# Patient Record
Sex: Male | Born: 1990 | Race: White | Hispanic: No | Marital: Single | Smoking: Current every day smoker
Health system: Southern US, Community
[De-identification: ages and names within clinical notes are randomized; demographics above are authoritative.]

## PROBLEM LIST (undated history)

## (undated) DIAGNOSIS — F1910 Other psychoactive substance abuse, uncomplicated: Secondary | ICD-10-CM

## (undated) DIAGNOSIS — T1491XA Suicide attempt, initial encounter: Secondary | ICD-10-CM

## (undated) DIAGNOSIS — Z8781 Personal history of (healed) traumatic fracture: Secondary | ICD-10-CM

## (undated) HISTORY — PX: FRACTURE SURGERY: SHX138

## (undated) HISTORY — PX: OTHER SURGICAL HISTORY: SHX169

---
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
## 2008-11-22 ENCOUNTER — Inpatient Hospital Stay (HOSPITAL_COMMUNITY): Admission: AD | Admit: 2008-11-22 | Discharge: 2008-11-24 | Payer: Self-pay | Admitting: *Deleted

## 2008-11-22 ENCOUNTER — Emergency Department (HOSPITAL_COMMUNITY): Admission: EM | Admit: 2008-11-22 | Discharge: 2008-11-22 | Payer: Self-pay | Admitting: Emergency Medicine

## 2008-11-22 ENCOUNTER — Ambulatory Visit: Payer: Self-pay | Admitting: *Deleted

## 2009-08-26 ENCOUNTER — Emergency Department (HOSPITAL_COMMUNITY): Admission: EM | Admit: 2009-08-26 | Discharge: 2009-08-26 | Payer: Self-pay | Admitting: Emergency Medicine

## 2010-07-14 LAB — RAPID URINE DRUG SCREEN, HOSP PERFORMED
Barbiturates: NOT DETECTED
Benzodiazepines: POSITIVE — AB
Cocaine: NOT DETECTED
Opiates: NOT DETECTED

## 2010-07-14 LAB — BASIC METABOLIC PANEL
CO2: 29 mEq/L (ref 19–32)
Calcium: 9.3 mg/dL (ref 8.4–10.5)
GFR calc Af Amer: >60 mL/min (ref >60)
GFR calc non Af Amer: >60 mL/min (ref >60)
Glucose, Bld: 86 mg/dL (ref 70–99)
Sodium: 141 mEq/L (ref 135–145)

## 2010-07-14 LAB — CBC
MCHC: 35.3 g/dL (ref 30.0–36.0)
MCV: 95.3 fL (ref 78.0–100.0)
RBC: 4.63 MIL/uL (ref 4.22–5.81)
RDW: 12.6 % (ref 11.5–15.5)
WBC: 6.8 10*3/uL (ref 4.0–10.5)
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
## 2011-03-04 ENCOUNTER — Emergency Department (HOSPITAL_COMMUNITY): Payer: Self-pay

## 2011-03-04 ENCOUNTER — Emergency Department (HOSPITAL_COMMUNITY)
Admission: EM | Admit: 2011-03-04 | Discharge: 2011-03-05 | Disposition: A | Discharge status: Home / Self Care | Payer: Self-pay | Attending: Emergency Medicine | Admitting: Emergency Medicine

## 2011-03-04 DIAGNOSIS — R109 Unspecified abdominal pain: Secondary | ICD-10-CM | POA: Insufficient documentation

## 2011-03-04 DIAGNOSIS — M542 Cervicalgia: Secondary | ICD-10-CM | POA: Insufficient documentation

## 2011-03-04 DIAGNOSIS — F17200 Nicotine dependence, unspecified, uncomplicated: Secondary | ICD-10-CM | POA: Insufficient documentation

## 2011-03-04 DIAGNOSIS — W1789XA Other fall from one level to another, initial encounter: Secondary | ICD-10-CM | POA: Insufficient documentation

## 2011-03-04 DIAGNOSIS — IMO0002 Reserved for concepts with insufficient information to code with codable children: Secondary | ICD-10-CM | POA: Insufficient documentation

## 2011-03-04 DIAGNOSIS — M25569 Pain in unspecified knee: Secondary | ICD-10-CM | POA: Insufficient documentation

## 2011-03-04 DIAGNOSIS — S32009A Unspecified fracture of unspecified lumbar vertebra, initial encounter for closed fracture: Secondary | ICD-10-CM | POA: Insufficient documentation

## 2011-03-04 DIAGNOSIS — R51 Headache: Secondary | ICD-10-CM | POA: Insufficient documentation

## 2011-03-04 DIAGNOSIS — R079 Chest pain, unspecified: Secondary | ICD-10-CM | POA: Insufficient documentation

## 2011-03-04 LAB — BASIC METABOLIC PANEL
CO2: 27 mEq/L (ref 19–32)
Chloride: 100 mEq/L (ref 96–112)
Creatinine, Ser: 0.91 mg/dL (ref 0.50–1.35)
GFR calc non Af Amer: >90 mL/min (ref >90)
Sodium: 139 mEq/L (ref 135–145)

## 2011-03-04 LAB — URINALYSIS, ROUTINE W REFLEX MICROSCOPIC
Glucose, UA: NEGATIVE mg/dL
Leukocytes, UA: NEGATIVE
Specific Gravity, Urine: <1.005 — ABNORMAL LOW (ref 1.005–1.030)
Urobilinogen, UA: 0.2 mg/dL (ref 0.0–1.0)
pH: 6 (ref 5.0–8.0)

## 2011-03-04 LAB — CBC
HCT: 50 % (ref 39.0–52.0)
MCH: 32.4 pg (ref 26.0–34.0)
RBC: 5.4 MIL/uL (ref 4.22–5.81)
RDW: 12.2 % (ref 11.5–15.5)
WBC: 15.1 10*3/uL — ABNORMAL HIGH (ref 4.0–10.5)

## 2011-03-04 LAB — URINE MICROSCOPIC-ADD ON

## 2011-03-04 MED ORDER — SODIUM CHLORIDE 0.9 % IV SOLN
INTRAVENOUS | Status: DC
  Administered 2011-03-04: 23:00:00 via INTRAVENOUS
  Administered 2011-03-05: 1000 mL via INTRAVENOUS

## 2011-03-04 MED ORDER — ONDANSETRON HCL 4 MG/2ML IJ SOLN
4.0000 mg | Freq: Once | INTRAMUSCULAR | Status: AC
  Administered 2011-03-04: 4 mg via INTRAVENOUS
  Filled 2011-03-04: qty 2

## 2011-03-04 MED ORDER — FENTANYL CITRATE 0.05 MG/ML IJ SOLN
50.0000 ug | Freq: Once | INTRAMUSCULAR | Status: AC
  Administered 2011-03-04: 23:00:00 via INTRAVENOUS
  Filled 2011-03-04: qty 2

## 2011-03-04 MED ORDER — IOHEXOL 300 MG/ML  SOLN
100.0000 mL | Freq: Once | INTRAMUSCULAR | Status: AC | PRN
  Administered 2011-03-04: 100 mL via INTRAVENOUS

## 2011-03-04 NOTE — ED Notes (Signed)
Pt states he fell 20 feet from a tree stand. Complain of low back pain, neck, right rib and broken teeth

## 2011-03-04 NOTE — ED Notes (Signed)
Hobson Bryant, PA at bedside.  
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
## 2011-03-05 ENCOUNTER — Emergency Department (HOSPITAL_COMMUNITY): Payer: Self-pay

## 2011-03-05 MED ORDER — OXYCODONE-ACETAMINOPHEN 5-325 MG PO TABS
1.0000 | ORAL_TABLET | Freq: Once | ORAL | Status: AC
  Administered 2011-03-05: 1 via ORAL
  Filled 2011-03-05: qty 1

## 2011-03-05 MED ORDER — IBUPROFEN 800 MG PO TABS
800.0000 mg | ORAL_TABLET | Freq: Once | ORAL | Status: AC
  Administered 2011-03-05: 800 mg via ORAL
  Filled 2011-03-05: qty 1

## 2011-03-05 MED ORDER — FENTANYL CITRATE 0.05 MG/ML IJ SOLN
100.0000 ug | Freq: Once | INTRAMUSCULAR | Status: AC
  Administered 2011-03-05: 100 ug via INTRAVENOUS
  Filled 2011-03-05: qty 2

## 2011-03-05 MED ORDER — DICLOFENAC SODIUM 75 MG PO TBEC: 75.0000 mg | DELAYED_RELEASE_TABLET | Freq: Two times a day (BID) | ORAL | Status: DC

## 2011-03-05 MED ORDER — DIAZEPAM 5 MG PO TABS: ORAL_TABLET | ORAL | Status: DC

## 2011-03-05 MED ORDER — OXYCODONE-ACETAMINOPHEN 5-325 MG PO TABS
1.0000 | ORAL_TABLET | ORAL | Status: AC | PRN
Start: 2011-03-05 — End: 2011-03-15

## 2011-03-05 MED ORDER — DIAZEPAM 5 MG PO TABS
5.0000 mg | ORAL_TABLET | Freq: Once | ORAL | Status: AC
  Administered 2011-03-05: 5 mg via ORAL
  Filled 2011-03-05: qty 1

## 2011-03-05 MED ORDER — FENTANYL CITRATE 0.05 MG/ML IJ SOLN
INTRAMUSCULAR | Status: AC
  Administered 2011-03-05: 50 ug via INTRAVENOUS
  Filled 2011-03-05: qty 2

## 2011-03-05 MED ORDER — DIAZEPAM 5 MG PO TABS
10.0000 mg | ORAL_TABLET | Freq: Once | ORAL | Status: AC
  Administered 2011-03-05: 10 mg via ORAL
  Filled 2011-03-05: qty 2

## 2011-03-05 MED ORDER — IOHEXOL 300 MG/ML  SOLN
70.0000 mL | Freq: Once | INTRAMUSCULAR | Status: AC | PRN
  Administered 2011-03-05: 70 mL via INTRAVENOUS

## 2011-03-05 NOTE — ED Provider Notes (Signed)
Medical screening examination/treatment/procedure(s) were performed by non-physician practitioner and as supervising physician I was immediately available for consultation/collaboration.  Katy Brickell T Azeneth Carbonell, MD 03/05/11 2137 

## 2011-03-10 ENCOUNTER — Emergency Department (HOSPITAL_COMMUNITY)
Admission: EM | Admit: 2011-03-10 | Discharge: 2011-03-10 | Disposition: A | Discharge status: Home / Self Care | Payer: Self-pay | Attending: Emergency Medicine | Admitting: Emergency Medicine

## 2011-03-10 ENCOUNTER — Encounter (HOSPITAL_COMMUNITY): Payer: Self-pay | Admitting: *Deleted

## 2011-03-10 DIAGNOSIS — F17200 Nicotine dependence, unspecified, uncomplicated: Secondary | ICD-10-CM | POA: Insufficient documentation

## 2011-03-10 DIAGNOSIS — M545 Low back pain, unspecified: Secondary | ICD-10-CM | POA: Insufficient documentation

## 2011-03-10 DIAGNOSIS — M549 Dorsalgia, unspecified: Secondary | ICD-10-CM

## 2011-03-10 MED ORDER — HYDROMORPHONE HCL PF 1 MG/ML IJ SOLN
1.0000 mg | Freq: Once | INTRAMUSCULAR | Status: AC
  Administered 2011-03-10: 1 mg via INTRAMUSCULAR
  Filled 2011-03-10: qty 1

## 2011-03-10 MED ORDER — OXYCODONE-ACETAMINOPHEN 5-325 MG PO TABS: 1.0000 | ORAL_TABLET | Freq: Four times a day (QID) | ORAL | Status: AC | PRN

## 2011-03-10 NOTE — ED Notes (Signed)
Patient with c/o lower back pain from fall from tree stand last Monday. Patient reports he is unable to follow up with his PCP due to no insurance. Patient reports "I hurt so bad and all I do is cry"

## 2011-03-10 NOTE — ED Notes (Signed)
Patient with no complaints at this time. Respirations even and unlabored. Skin warm/dry. Discharge instructions reviewed with patient at this time. Patient given opportunity to voice concerns/ask questions. Patient discharged at this time and left Emergency Department with steady gait.   
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
## 2011-03-10 NOTE — ED Notes (Addendum)
Pt c/o lower back; pt states he fell x 6 days ago from a deer stand approx 20 feet; pt was brought in on the day it happened by recms and discharged

## 2011-03-10 NOTE — ED Notes (Signed)
Patient requesting pain medication. Patient informed the MD will have to assess him before he can get any pain medication. Patient gave verbal understanding.

## 2012-02-26 ENCOUNTER — Encounter (HOSPITAL_COMMUNITY): Payer: Self-pay | Admitting: Emergency Medicine

## 2012-02-26 ENCOUNTER — Emergency Department (HOSPITAL_COMMUNITY)
Admission: EM | Admit: 2012-02-26 | Discharge: 2012-02-26 | Disposition: A | Discharge status: Home / Self Care | Payer: 59 | Attending: Emergency Medicine | Admitting: Emergency Medicine

## 2012-02-26 DIAGNOSIS — F17200 Nicotine dependence, unspecified, uncomplicated: Secondary | ICD-10-CM | POA: Insufficient documentation

## 2012-02-26 DIAGNOSIS — K089 Disorder of teeth and supporting structures, unspecified: Secondary | ICD-10-CM | POA: Insufficient documentation

## 2012-02-26 DIAGNOSIS — K0889 Other specified disorders of teeth and supporting structures: Secondary | ICD-10-CM

## 2012-02-26 DIAGNOSIS — R110 Nausea: Secondary | ICD-10-CM | POA: Insufficient documentation

## 2012-02-26 DIAGNOSIS — Z79899 Other long term (current) drug therapy: Secondary | ICD-10-CM | POA: Insufficient documentation

## 2012-02-26 DIAGNOSIS — M25569 Pain in unspecified knee: Secondary | ICD-10-CM

## 2012-02-26 MED ORDER — KETOROLAC TROMETHAMINE 10 MG PO TABS
10.0000 mg | ORAL_TABLET | Freq: Once | ORAL | Status: AC
  Administered 2012-02-26: 10 mg via ORAL
  Filled 2012-02-26: qty 1

## 2012-02-26 MED ORDER — AMOXICILLIN 250 MG PO CAPS
500.0000 mg | ORAL_CAPSULE | Freq: Once | ORAL | Status: AC
  Administered 2012-02-26: 500 mg via ORAL
  Filled 2012-02-26: qty 2

## 2012-02-26 MED ORDER — DICLOFENAC SODIUM 75 MG PO TBEC: 75.0000 mg | DELAYED_RELEASE_TABLET | Freq: Two times a day (BID) | ORAL | Status: DC

## 2012-02-26 MED ORDER — PROMETHAZINE HCL 12.5 MG PO TABS
25.0000 mg | ORAL_TABLET | Freq: Once | ORAL | Status: AC
  Administered 2012-02-26: 25 mg via ORAL
  Filled 2012-02-26 (×2): qty 1

## 2012-02-26 MED ORDER — HYDROCODONE-ACETAMINOPHEN 5-325 MG PO TABS
2.0000 | ORAL_TABLET | Freq: Once | ORAL | Status: AC
  Administered 2012-02-26: 2 via ORAL
  Filled 2012-02-26: qty 2

## 2012-02-26 MED ORDER — HYDROCODONE-ACETAMINOPHEN 5-325 MG PO TABS: 1.0000 | ORAL_TABLET | Freq: Four times a day (QID) | ORAL | Status: DC | PRN

## 2012-02-26 MED ORDER — AMOXICILLIN 500 MG PO CAPS: 500.0000 mg | ORAL_CAPSULE | Freq: Three times a day (TID) | ORAL | Status: DC

## 2012-02-26 NOTE — ED Notes (Signed)
Dental pain for a while. Has appt to see dentist on March 09, 2012. Pt stats has screws in left knee and it has been "acting up" since yesterday.

## 2012-02-26 NOTE — ED Provider Notes (Signed)
History     CSN: [id number]  Arrival date & time 02/26/12  0745   First MD Initiated Contact with Patient 02/26/12 0802      Chief Complaint  Patient presents with  . Dental Pain  . Knee Pain    (Consider location/radiation/quality/duration/timing/severity/associated sxs/prior treatment) Patient is a 21 y.o. male presenting with tooth pain and knee pain. The history is provided by the patient.  Dental PainThe primary symptoms include mouth pain. Primary symptoms do not include shortness of breath or cough. The symptoms began yesterday. The symptoms are unchanged. The symptoms are chronic. The symptoms occur constantly.  Additional symptoms include: gum swelling and jaw pain. Additional symptoms do not include: nosebleeds. Medical issues include: smoking.   Knee Pain This is a chronic problem. The current episode started more than 1 month ago. The problem occurs daily. The problem has been gradually worsening. Associated symptoms include arthralgias, joint swelling and nausea. Pertinent negatives include no abdominal pain, chest pain, coughing or neck pain. The symptoms are aggravated by standing and walking (chewing). He has tried nothing for the symptoms. The treatment provided no relief.    History reviewed. No pertinent past medical history.  Past Surgical History  Procedure Date  . Fracture surgery     History reviewed. No pertinent family history.  History  Substance Use Topics  . Smoking status: Current Every Day Smoker  . Smokeless tobacco: Not on file  . Alcohol Use: Yes     Comment: occ      Review of Systems  Constitutional: Negative for activity change.       All ROS Neg except as noted in HPI  HENT: Positive for dental problem. Negative for nosebleeds and neck pain.   Eyes: Negative for photophobia and discharge.  Respiratory: Negative for cough, shortness of breath and wheezing.   Cardiovascular: Negative for chest pain and palpitations.    Gastrointestinal: Positive for nausea. Negative for abdominal pain and blood in stool.  Genitourinary: Negative for dysuria, frequency and hematuria.  Musculoskeletal: Positive for joint swelling and arthralgias. Negative for back pain.  Skin: Negative.   Neurological: Negative for dizziness, seizures and speech difficulty.  Psychiatric/Behavioral: Negative for hallucinations and confusion.    Allergies  Review of patient's allergies indicates no known allergies.  Home Medications   Current Outpatient Rx  Name  Route  Sig  Dispense  Refill  . DIAZEPAM 5 MG PO TABS      1 po tid for spasm-pain   18 tablet   0   . DICLOFENAC SODIUM 75 MG PO TBEC   Oral   Take 1 tablet (75 mg total) by mouth 2 (two) times daily.   14 tablet   0     BP 144/75  Pulse 99  Temp 98.9 F (37.2 C) (Oral)  Resp 16  Ht 5\' 11"  (1.803 m)  Wt 155 lb (70.308 kg)  BMI 21.62 kg/m2  SpO2 98%  Physical Exam  Nursing note and vitals reviewed. Constitutional: He is oriented to person, place, and time. He appears well-developed and well-nourished.  Non-toxic appearance.  HENT:  Head: Normocephalic.  Right Ear: Tympanic membrane and external ear normal.  Left Ear: Tympanic membrane and external ear normal.       Cavity of the right upper 2nd molar. Mod swelling of the gum. No visible abscess. No swelling under the tongue.  Eyes: EOM and lids are normal. Pupils are equal, round, and reactive to light.  Neck: Normal range of motion.  Neck supple. Carotid bruit is not present.  Cardiovascular: Normal rate, regular rhythm, normal heart sounds, intact distal pulses and normal pulses.   Pulmonary/Chest: Breath sounds normal. No respiratory distress.  Abdominal: Soft. Bowel sounds are normal. There is no tenderness. There is no guarding.  Musculoskeletal: Normal range of motion.       Medial left knee pain to palpation and with ROM. No effusion. Not hot.  Lymphadenopathy:       Head (right side): No  submandibular adenopathy present.       Head (left side): No submandibular adenopathy present.    He has no cervical adenopathy.  Neurological: He is alert and oriented to person, place, and time. He has normal strength. No cranial nerve deficit or sensory deficit.  Skin: Skin is warm and dry.  Psychiatric: He has a normal mood and affect. His speech is normal.    ED Course  Procedures (including critical care time)  Labs Reviewed - No data to display No results found.   No diagnosis found.    MDM  I have reviewed nursing notes, vital signs, and all appropriate lab and imaging results for this patient. Pt has chronic knee pain. He has long term problem with his teeth. He is scheduled for appointment on Dec. 2. To have teeth fixed. Rx for diclofenac,Amoxil, and norco given to the patient.       Kathie Dike, [place] 02/26/12 [phone]

## 2012-02-29 NOTE — ED Provider Notes (Signed)
Medical screening examination/treatment/procedure(s) were performed by non-physician practitioner and as supervising physician I was immediately available for consultation/collaboration.  Beverly Ferner, MD 02/29/12 1610 

## 2012-04-21 ENCOUNTER — Emergency Department (HOSPITAL_COMMUNITY): Payer: 59

## 2012-04-21 ENCOUNTER — Emergency Department (HOSPITAL_COMMUNITY)
Admission: EM | Admit: 2012-04-21 | Discharge: 2012-04-21 | Disposition: A | Discharge status: Home / Self Care | Payer: 59 | Attending: Emergency Medicine | Admitting: Emergency Medicine

## 2012-04-21 ENCOUNTER — Encounter (HOSPITAL_COMMUNITY): Payer: Self-pay | Admitting: *Deleted

## 2012-04-21 DIAGNOSIS — S99919A Unspecified injury of unspecified ankle, initial encounter: Secondary | ICD-10-CM | POA: Insufficient documentation

## 2012-04-21 DIAGNOSIS — W010XXA Fall on same level from slipping, tripping and stumbling without subsequent striking against object, initial encounter: Secondary | ICD-10-CM | POA: Insufficient documentation

## 2012-04-21 DIAGNOSIS — F17200 Nicotine dependence, unspecified, uncomplicated: Secondary | ICD-10-CM | POA: Insufficient documentation

## 2012-04-21 DIAGNOSIS — S8990XA Unspecified injury of unspecified lower leg, initial encounter: Secondary | ICD-10-CM | POA: Insufficient documentation

## 2012-04-21 DIAGNOSIS — Y939 Activity, unspecified: Secondary | ICD-10-CM | POA: Insufficient documentation

## 2012-04-21 DIAGNOSIS — M25562 Pain in left knee: Secondary | ICD-10-CM

## 2012-04-21 DIAGNOSIS — Y929 Unspecified place or not applicable: Secondary | ICD-10-CM | POA: Insufficient documentation

## 2012-04-21 MED ORDER — HYDROCODONE-ACETAMINOPHEN 5-325 MG PO TABS: 1.0000 | ORAL_TABLET | Freq: Four times a day (QID) | ORAL | Status: AC | PRN

## 2012-04-21 MED ORDER — HYDROCODONE-ACETAMINOPHEN 5-325 MG PO TABS
1.0000 | ORAL_TABLET | Freq: Once | ORAL | Status: AC
  Administered 2012-04-21: 1 via ORAL
  Filled 2012-04-21: qty 1

## 2012-04-21 MED ORDER — IBUPROFEN 800 MG PO TABS
800.0000 mg | ORAL_TABLET | Freq: Once | ORAL | Status: AC
  Administered 2012-04-21: 800 mg via ORAL
  Filled 2012-04-21: qty 1

## 2012-04-21 NOTE — ED Notes (Signed)
Knee immobilizer applied to L leg. Pt reports he has crutches at home.

## 2012-04-21 NOTE — ED Provider Notes (Signed)
History     CSN: [id number]  Arrival date & time 04/21/12  1232   First MD Initiated Contact with Patient 04/21/12 1321      Chief Complaint  Patient presents with  . Knee Pain    (Consider location/radiation/quality/duration/timing/severity/associated sxs/prior treatment) HPI Comments: Slipped and fell on L knee last PM.  No other injuries.  Patient is a 22 y.o. male presenting with knee pain. The history is provided by the patient. No language interpreter was used.  Knee Pain This is a new problem. The current episode started yesterday. The problem occurs constantly. The problem has been unchanged. Pertinent negatives include no chills or fever. The symptoms are aggravated by walking and standing. He has tried acetaminophen for the symptoms. The treatment provided no relief.    History reviewed. No pertinent past medical history.  Past Surgical History  Procedure Date  . Fracture surgery     History reviewed. No pertinent family history.  History  Substance Use Topics  . Smoking status: Current Every Day Smoker  . Smokeless tobacco: Not on file  . Alcohol Use: No     Comment: occ      Review of Systems  Constitutional: Negative for fever and chills.  Musculoskeletal:       Knee injury  Skin: Negative for wound.  All other systems reviewed and are negative.    Allergies  Review of patient's allergies indicates no known allergies.  Home Medications  No current outpatient prescriptions on file.  BP 128/73  Pulse 74  Temp 97.8 F (36.6 C) (Oral)  Resp 20  Ht 5\' 11"  (1.803 m)  Wt 155 lb (70.308 kg)  BMI 21.62 kg/m2  SpO2 100%  Physical Exam  Nursing note and vitals reviewed. Constitutional: He is oriented to person, place, and time. He appears well-developed and well-nourished.  HENT:  Head: Normocephalic and atraumatic.  Eyes: EOM are normal.  Neck: Normal range of motion.  Cardiovascular: Normal rate, regular rhythm, normal heart sounds and  intact distal pulses.   Pulmonary/Chest: Effort normal and breath sounds normal. No respiratory distress.  Abdominal: Soft. He exhibits no distension. There is no tenderness.  Musculoskeletal: He exhibits tenderness.       Left knee: He exhibits decreased range of motion. He exhibits no swelling, no effusion, no ecchymosis, no deformity, no laceration and no erythema. tenderness found. Medial joint line tenderness noted.       Legs: Neurological: He is alert and oriented to person, place, and time.  Skin: Skin is warm and dry.  Psychiatric: He has a normal mood and affect. Judgment normal.    ED Course  Procedures (including critical care time)  Labs Reviewed - No data to display Dg Knee Complete 4 Views Left  04/21/2012  *RADIOLOGY REPORT*  Clinical Data: Medial knee pain.  Fall, twisting injury.  LEFT KNEE - COMPLETE 4+ VIEW  Comparison: 03/04/2011  Findings: Intermedullary rod crosses a healed distal left femoral shaft fracture.  No hardware or bony complicating feature.  No acute bony abnormality.  No joint effusion.  IMPRESSION: No acute bony abnormality.   Original Report Authenticated By: Charlett Nose, M.D.      1. Left knee pain       MDM  Knee immob, has crutches at home. Ice Ibuprofen rx-hydrocodone, 20 F/u with your ortho ASAP        Evalina Field, PA 04/21/12 1637

## 2012-04-21 NOTE — ED Notes (Signed)
Slipped and fell last nigh, Pain lt knee

## 2012-04-22 NOTE — ED Provider Notes (Signed)
Medical screening examination/treatment/procedure(s) were performed by non-physician practitioner and as supervising physician I was immediately available for consultation/collaboration.   Charles B. Sheldon, MD 04/22/12 0852 

## 2012-07-13 ENCOUNTER — Ambulatory Visit (INDEPENDENT_AMBULATORY_CARE_PROVIDER_SITE_OTHER): Payer: Self-pay

## 2012-07-13 ENCOUNTER — Ambulatory Visit: Payer: Self-pay | Admitting: Nurse Practitioner

## 2012-07-13 ENCOUNTER — Encounter: Payer: Self-pay | Admitting: Nurse Practitioner

## 2012-07-13 VITALS — BP 168/110 | HR 91 | Temp 97.9°F | Ht 71.0 in | Wt 173.0 lb

## 2012-07-13 DIAGNOSIS — S4980XA Other specified injuries of shoulder and upper arm, unspecified arm, initial encounter: Secondary | ICD-10-CM

## 2012-07-13 DIAGNOSIS — S8990XA Unspecified injury of unspecified lower leg, initial encounter: Secondary | ICD-10-CM

## 2012-07-13 DIAGNOSIS — S46909A Unspecified injury of unspecified muscle, fascia and tendon at shoulder and upper arm level, unspecified arm, initial encounter: Secondary | ICD-10-CM

## 2012-07-13 DIAGNOSIS — W19XXXA Unspecified fall, initial encounter: Secondary | ICD-10-CM

## 2012-07-13 DIAGNOSIS — T1490XA Injury, unspecified, initial encounter: Secondary | ICD-10-CM

## 2012-07-13 DIAGNOSIS — S99929A Unspecified injury of unspecified foot, initial encounter: Secondary | ICD-10-CM

## 2012-07-13 MED ORDER — HYDROCODONE-ACETAMINOPHEN 5-325 MG PO TABS: 1.0000 | ORAL_TABLET | Freq: Three times a day (TID) | ORAL | Status: DC | PRN

## 2012-07-13 NOTE — Patient Instructions (Signed)
Left knee contusion and right Scapula contusion Rest Ice if helps Vicodin as ordered RTO PRN Out of work X3 days  Adam Daphine Deutscher, FNP
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
## 2012-08-13 ENCOUNTER — Other Ambulatory Visit: Payer: Self-pay | Admitting: *Deleted

## 2012-08-13 MED ORDER — HYDROCODONE-ACETAMINOPHEN 5-325 MG PO TABS: 1.0000 | ORAL_TABLET | Freq: Three times a day (TID) | ORAL | Status: DC | PRN

## 2012-08-13 NOTE — Telephone Encounter (Signed)
Patient aware.

## 2012-08-13 NOTE — Telephone Encounter (Signed)
LAST RF 07/13/12. PRINT FOR PT TO PICKUP. LAST OV 1/14 WITH ACM

## 2012-08-13 NOTE — Telephone Encounter (Signed)
RX ready to be picked up 

## 2012-09-09 ENCOUNTER — Other Ambulatory Visit: Payer: Self-pay

## 2012-09-09 MED ORDER — HYDROCODONE-ACETAMINOPHEN 5-325 MG PO TABS: 1.0000 | ORAL_TABLET | Freq: Three times a day (TID) | ORAL | Status: DC | PRN

## 2012-09-09 NOTE — Telephone Encounter (Signed)
rx ready for pickup 

## 2012-09-09 NOTE — Telephone Encounter (Signed)
Last filled 08/13/12  Last seen 07/13/12   If approved print and have nurse call patient to pick up

## 2012-09-10 ENCOUNTER — Telehealth: Payer: Self-pay | Admitting: Nurse Practitioner

## 2012-09-10 NOTE — Telephone Encounter (Signed)
Spoke with patient.

## 2012-09-10 NOTE — Telephone Encounter (Signed)
Pharmacy aware,rx at our office. No vm at pt number.

## 2012-10-06 ENCOUNTER — Other Ambulatory Visit: Payer: Self-pay

## 2012-10-06 MED ORDER — HYDROCODONE-ACETAMINOPHEN 5-325 MG PO TABS: 1.0000 | ORAL_TABLET | Freq: Three times a day (TID) | ORAL | Status: DC | PRN

## 2012-10-06 NOTE — Telephone Encounter (Signed)
Last seen 07/13/12  MMM  Last filled 09/09/12   If approved print and have nurse call patient to pick up

## 2012-10-06 NOTE — Telephone Encounter (Signed)
rx ready for pickup 

## 2012-10-07 NOTE — Telephone Encounter (Signed)
Up front 

## 2012-11-06 ENCOUNTER — Other Ambulatory Visit: Payer: Self-pay

## 2012-11-06 NOTE — Telephone Encounter (Signed)
Last filled 10/06/12  MMM   Will be out today    If approved print and route to nurse

## 2012-11-06 NOTE — Telephone Encounter (Signed)
Last seen 07/13/12  MMM  Last filled 10/06/12   If approved print and route to nurse

## 2012-11-09 MED ORDER — HYDROCODONE-ACETAMINOPHEN 5-325 MG PO TABS: 1.0000 | ORAL_TABLET | Freq: Three times a day (TID) | ORAL | Status: DC | PRN

## 2012-11-09 NOTE — Telephone Encounter (Signed)
rx ready for pickup 

## 2012-11-09 NOTE — Telephone Encounter (Signed)
No phone number available for patient. Unable to reach emergency contact by phone. Script called in to pharmacy and left with pharmacist Omar Person at [facility] in [place].

## 2012-11-11 ENCOUNTER — Telehealth: Payer: Self-pay | Admitting: Nurse Practitioner

## 2012-12-09 ENCOUNTER — Other Ambulatory Visit: Payer: Self-pay | Admitting: *Deleted

## 2012-12-09 NOTE — Telephone Encounter (Signed)
Could you find out if this patient's pain is related to fall off porch in the past. If so he may need further evaluation. thx

## 2012-12-09 NOTE — Telephone Encounter (Signed)
LAST RF 11/09/12. LAST OV 4/14. PRINT AND CALL PT. THANKS.

## 2012-12-11 ENCOUNTER — Other Ambulatory Visit: Payer: Self-pay | Admitting: General Practice

## 2012-12-11 DIAGNOSIS — M545 Low back pain: Secondary | ICD-10-CM

## 2012-12-11 MED ORDER — HYDROCODONE-ACETAMINOPHEN 5-325 MG PO TABS: 1.0000 | ORAL_TABLET | Freq: Three times a day (TID) | ORAL | Status: DC | PRN

## 2012-12-11 NOTE — Progress Notes (Signed)
Patient presents today to pick up narcotic script. Discussed tapering off pain medication. He verbalized he will come in next week for xray of back and in agreement with ortho referral if needed.

## 2012-12-16 NOTE — Telephone Encounter (Signed)
Script given to pt

## 2013-02-09 ENCOUNTER — Other Ambulatory Visit: Payer: Self-pay

## 2013-02-09 DIAGNOSIS — M545 Low back pain: Secondary | ICD-10-CM

## 2013-02-09 NOTE — Telephone Encounter (Signed)
Last seen 07/13/12  MMM   If approved print and route to nurse

## 2013-02-09 NOTE — Telephone Encounter (Signed)
According to chart patient has been dismissed

## 2013-02-18 ENCOUNTER — Telehealth: Payer: Self-pay | Admitting: Nurse Practitioner

## 2013-05-23 ENCOUNTER — Encounter (HOSPITAL_COMMUNITY): Payer: Self-pay | Admitting: Emergency Medicine

## 2013-05-23 ENCOUNTER — Emergency Department (HOSPITAL_COMMUNITY)
Admission: EM | Admit: 2013-05-23 | Discharge: 2013-05-23 | Disposition: A | Discharge status: Home / Self Care | Payer: 59 | Attending: Emergency Medicine | Admitting: Emergency Medicine

## 2013-05-23 DIAGNOSIS — K0381 Cracked tooth: Secondary | ICD-10-CM | POA: Insufficient documentation

## 2013-05-23 DIAGNOSIS — F17200 Nicotine dependence, unspecified, uncomplicated: Secondary | ICD-10-CM | POA: Insufficient documentation

## 2013-05-23 DIAGNOSIS — K047 Periapical abscess without sinus: Secondary | ICD-10-CM

## 2013-05-23 DIAGNOSIS — H9209 Otalgia, unspecified ear: Secondary | ICD-10-CM | POA: Insufficient documentation

## 2013-05-23 MED ORDER — AMOXICILLIN 250 MG PO CAPS
500.0000 mg | ORAL_CAPSULE | Freq: Once | ORAL | Status: AC
  Administered 2013-05-23: 500 mg via ORAL
  Filled 2013-05-23: qty 2

## 2013-05-23 MED ORDER — AMOXICILLIN 500 MG PO CAPS: 500.0000 mg | ORAL_CAPSULE | Freq: Three times a day (TID) | ORAL | Status: DC

## 2013-05-23 MED ORDER — TRAMADOL HCL 50 MG PO TABS: 50.0000 mg | ORAL_TABLET | Freq: Four times a day (QID) | ORAL | Status: DC | PRN

## 2013-05-23 MED ORDER — TRAMADOL HCL 50 MG PO TABS
50.0000 mg | ORAL_TABLET | Freq: Once | ORAL | Status: AC
  Administered 2013-05-23: 50 mg via ORAL
  Filled 2013-05-23: qty 1

## 2013-05-23 NOTE — ED Provider Notes (Signed)
CSN: [id number]     Arrival date & time 05/23/13  1323 History  This chart was scribed for non-physician practitioner Burgess AmorJulie Jakyri Brunkhorst, PA-C working with Benny LennertJoseph L Zammit, MD by Leone PayorSonum Patel, ED Scribe. This patient was seen in room APFT24/APFT24 and the patient's care was started at 1:40 PM.     Chief Complaint  Patient presents with  . Dental Pain      The history is provided by the patient. No language interpreter was used.    HPI Comments: Adam Sanders is a 23 y.o. male who presents to the Emergency Department complaining of 2 days of gradual onset, gradually worsening, constant left upper dental pain. Pt states this tooth broke several months ago but did not have these symptoms until recently. He has associated left jaw pain, left ear pain, and left facial pain. He has taken ibuprofen, aspirin, and tylenol, OTC oral gel without relief. He states cold liquids worsen the pain. He denies fever. Pt is a current everyday smoker and occasional alcohol user.   Dentist Dr. Laural BenesJohnson   History reviewed. No pertinent past medical history. Past Surgical History  Procedure Laterality Date  . Fracture surgery     Family History  Problem Relation Age of Onset  . Healthy Mother   . Hypertension Father   . Heart disease Father   . Stroke Father   . Healthy Sister    History  Substance Use Topics  . Smoking status: Current Every Day Smoker  . Smokeless tobacco: Not on file  . Alcohol Use: No     Comment: occ    Review of Systems  Constitutional: Negative for fever.  HENT: Positive for dental problem (left upper) and ear pain (left). Negative for facial swelling and sore throat.   Respiratory: Negative for shortness of breath.   Musculoskeletal: Negative for neck pain and neck stiffness.      Allergies  Review of patient's allergies indicates no known allergies.  Home Medications   Current Outpatient Rx  Name  Route  Sig  Dispense  Refill  . ibuprofen (ADVIL,MOTRIN) 200 MG tablet   Oral   Take 400 mg by mouth every 8 (eight) hours as needed for moderate pain.         Marland Kitchen. amoxicillin (AMOXIL) 500 MG capsule   Oral   Take 1 capsule (500 mg total) by mouth 3 (three) times daily.   30 capsule   0   . traMADol (ULTRAM) 50 MG tablet   Oral   Take 1 tablet (50 mg total) by mouth every 6 (six) hours as needed for moderate pain.   20 tablet   0    BP 155/84  Pulse 67  Temp(Src) 98.4 F (36.9 C) (Oral)  Resp 24  SpO2 98% Physical Exam  Nursing note and vitals reviewed. Constitutional: He is oriented to person, place, and time. He appears well-developed and well-nourished. No distress.  HENT:  Head: Normocephalic and atraumatic.  Right Ear: Tympanic membrane and external ear normal.  Left Ear: Tympanic membrane and external ear normal.  Mouth/Throat: Oropharynx is clear and moist and mucous membranes are normal. No oral lesions. No trismus in the jaw. No dental abscesses. No oropharyngeal exudate, posterior oropharyngeal edema, posterior oropharyngeal erythema or tonsillar abscesses.  Left upper premolar has large fracture which appears old. Mild surrounding gingival edema without obvious fluctuance or pus pocket. Subungual space is soft.   Eyes: Conjunctivae are normal.  Neck: Normal range of motion. Neck supple.  Cardiovascular:  Normal rate and normal heart sounds.   Pulmonary/Chest: Effort normal.  Abdominal: He exhibits no distension.  Musculoskeletal: Normal range of motion.  Lymphadenopathy:    He has no cervical adenopathy.  Neurological: He is alert and oriented to person, place, and time.  Skin: Skin is warm and dry. No erythema.  Psychiatric: He has a normal mood and affect.    ED Course  Procedures (including critical care time)  DIAGNOSTIC STUDIES: Oxygen Saturation is 98% on RA, normal by my interpretation.    COORDINATION OF CARE: 2:11 PM Will prescribe antibiotics and pain medication. Discussed treatment plan with pt at bedside and pt  agreed to plan.   Labs Review Labs Reviewed - No data to display Imaging Review No results found.  EKG Interpretation   None       MDM   Final diagnoses:  Dental abscess    Pt was prescribed amoxil, tramadol.  Encouraged he contact his dentist tomorrow for definitive care.  Pt understands plan.    The patient appears reasonably screened and/or stabilized for discharge and I doubt any other medical condition or other [facility] requiring further screening, evaluation, or treatment in the ED at this time prior to discharge.   I personally performed the services described in this documentation, which was scribed in my presence. The recorded information has been reviewed and is accurate.   Burgess Amor, PA-C 05/23/13 1642

## 2013-05-23 NOTE — Discharge Instructions (Signed)
Abscessed Tooth An abscessed tooth is an infection around your tooth. It may be caused by holes or damage to the tooth (cavity) or a dental disease. An abscessed tooth causes mild to very bad pain in and around the tooth. See your dentist right away if you have tooth or gum pain. HOME CARE  Take your medicine as told. Finish it even if you start to feel better.  Do not drive after taking pain medicine.  Rinse your mouth (gargle) often with salt water ( teaspoon salt in 8 ounces of warm water).  Do not apply heat to the outside of your face. GET HELP RIGHT AWAY IF:   You have a temperature by mouth above 102 F (38.9 C), not controlled by medicine.  You have chills and a very bad headache.  You have problems breathing or swallowing.  Your mouth will not open.  You develop puffiness (swelling) on the neck or around the eye.  Your pain is not helped by medicine.  Your pain is getting worse instead of better. MAKE SURE YOU:   Understand these instructions.  Will watch your condition.  Will get help right away if you are not doing well or get worse. Document Released: 09/11/2007 Document Revised: 06/17/2011 Document Reviewed: 07/03/2010 [facility] Patient Information 2014 [place], [place].   Complete your entire course of antibiotics as prescribed.  You  may use the tramadol for pain relief but do not drive within 4 hours of taking as this will make you drowsy.  Avoid applying heat or ice to this abscess area which can worsen your symptoms.  You may use warm salt water swish and spit treatment or half peroxide and water swish and spit after meals to keep this area clean as discussed.  Call your dentist for further management of your symptoms.

## 2013-05-23 NOTE — ED Notes (Signed)
Left upper dental pain x 2 days. Took aspirin and ibuprofen with little relief. No obvious swelling noted.

## 2013-05-24 NOTE — ED Provider Notes (Signed)
Medical screening examination/treatment/procedure(s) were performed by non-physician practitioner and as supervising physician I was immediately available for consultation/collaboration.  EKG Interpretation   None         Benny LennertJoseph L Ory Elting, MD 05/24/13 [id number]

## 2017-04-10 ENCOUNTER — Emergency Department (HOSPITAL_COMMUNITY): Payer: Self-pay

## 2017-04-10 ENCOUNTER — Emergency Department (HOSPITAL_COMMUNITY)
Admission: EM | Admit: 2017-04-10 | Discharge: 2017-04-11 | Disposition: A | Discharge status: Home / Self Care | Payer: Self-pay | Attending: Emergency Medicine | Admitting: Emergency Medicine

## 2017-04-10 ENCOUNTER — Encounter (HOSPITAL_COMMUNITY): Payer: Self-pay | Admitting: *Deleted

## 2017-04-10 DIAGNOSIS — R109 Unspecified abdominal pain: Secondary | ICD-10-CM

## 2017-04-10 DIAGNOSIS — R110 Nausea: Secondary | ICD-10-CM | POA: Insufficient documentation

## 2017-04-10 DIAGNOSIS — F17210 Nicotine dependence, cigarettes, uncomplicated: Secondary | ICD-10-CM | POA: Insufficient documentation

## 2017-04-10 DIAGNOSIS — R1031 Right lower quadrant pain: Secondary | ICD-10-CM | POA: Insufficient documentation

## 2017-04-10 LAB — CBC WITH DIFFERENTIAL/PLATELET
Basophils Absolute: 0 10*3/uL (ref 0.0–0.1)
Basophils Relative: 0 %
Eosinophils Absolute: 0.1 10*3/uL (ref 0.0–0.7)
Eosinophils Relative: 1 %
HCT: 48.6 % (ref 39.0–52.0)
Hemoglobin: 16.4 g/dL (ref 13.0–17.0)
LYMPHS ABS: 2.2 10*3/uL (ref 0.7–4.0)
LYMPHS PCT: 25 %
MCH: 31 pg (ref 26.0–34.0)
MCHC: 33.7 g/dL (ref 30.0–36.0)
MCV: 91.9 fL (ref 78.0–100.0)
Monocytes Absolute: 0.5 10*3/uL (ref 0.1–1.0)
Monocytes Relative: 6 %
NEUTROS PCT: 68 %
Neutro Abs: 6 10*3/uL (ref 1.7–7.7)
Platelets: 292 10*3/uL (ref 150–400)
RBC: 5.29 MIL/uL (ref 4.22–5.81)
RDW: 12.4 % (ref 11.5–15.5)
WBC: 8.8 10*3/uL (ref 4.0–10.5)

## 2017-04-10 LAB — URINALYSIS, ROUTINE W REFLEX MICROSCOPIC
BACTERIA UA: NONE SEEN
Bilirubin Urine: NEGATIVE
Glucose, UA: NEGATIVE mg/dL
HGB URINE DIPSTICK: NEGATIVE
Ketones, ur: 5 mg/dL — AB
Leukocytes, UA: NEGATIVE
NITRITE: NEGATIVE
Protein, ur: 30 mg/dL — AB
SPECIFIC GRAVITY, URINE: 1.032 — AB (ref 1.005–1.030)
SQUAMOUS EPITHELIAL / LPF: NONE SEEN
pH: 5 (ref 5.0–8.0)

## 2017-04-10 LAB — COMPREHENSIVE METABOLIC PANEL
ALT: 37 U/L (ref 17–63)
AST: 24 U/L (ref 15–41)
Albumin: 4.8 g/dL (ref 3.5–5.0)
Alkaline Phosphatase: 102 U/L (ref 38–126)
Anion gap: 14 (ref 5–15)
BILIRUBIN TOTAL: 1 mg/dL (ref 0.3–1.2)
BUN: 14 mg/dL (ref 6–20)
CALCIUM: 9.9 mg/dL (ref 8.9–10.3)
CO2: 23 mmol/L (ref 22–32)
CREATININE: 0.94 mg/dL (ref 0.61–1.24)
Chloride: 103 mmol/L (ref 101–111)
Glucose, Bld: 95 mg/dL (ref 65–99)
Potassium: 3.6 mmol/L (ref 3.5–5.1)
Sodium: 140 mmol/L (ref 135–145)
Total Protein: 8 g/dL (ref 6.5–8.1)

## 2017-04-10 LAB — LIPASE, BLOOD: LIPASE: 31 U/L (ref 11–51)

## 2017-04-10 MED ORDER — ONDANSETRON HCL 4 MG/2ML IJ SOLN
4.0000 mg | Freq: Once | INTRAMUSCULAR | Status: AC
  Administered 2017-04-10: 4 mg via INTRAVENOUS
  Filled 2017-04-10: qty 2

## 2017-04-10 MED ORDER — OXYCODONE-ACETAMINOPHEN 5-325 MG PO TABS
1.0000 | ORAL_TABLET | Freq: Once | ORAL | Status: AC
  Administered 2017-04-10: 1 via ORAL
  Filled 2017-04-10: qty 1

## 2017-04-10 MED ORDER — IOPAMIDOL (ISOVUE-300) INJECTION 61%
100.0000 mL | Freq: Once | INTRAVENOUS | Status: AC | PRN
  Administered 2017-04-10: 100 mL via INTRAVENOUS

## 2017-04-10 MED ORDER — MORPHINE SULFATE (PF) 4 MG/ML IV SOLN
4.0000 mg | Freq: Once | INTRAVENOUS | Status: AC
  Administered 2017-04-10: 4 mg via INTRAVENOUS
  Filled 2017-04-10: qty 1

## 2017-04-10 NOTE — ED Triage Notes (Signed)
Onset of right lower abdominal pain that wraps around to his right lower back yesterday evening around 6pm, associated with nausea. Pt says that it is taking him a long time to start urinating when he feels like he has to go. Last took oxycodone 15 mg two hours ago. Denies fevers.

## 2017-04-10 NOTE — ED Notes (Signed)
Patient educated about not driving or performing other critical tasks (such as operating heavy machinery, caring for infant/toddler/child) due to sedative nature of narcotic medications received while in the ED.  Pt/caregiver verbalized understanding.   

## 2017-04-11 MED ORDER — HYDROCODONE-ACETAMINOPHEN 5-325 MG PO TABS: ORAL_TABLET | ORAL | 0 refills | Status: DC

## 2017-04-11 MED ORDER — IBUPROFEN 800 MG PO TABS: 800.0000 mg | ORAL_TABLET | Freq: Three times a day (TID) | ORAL | 0 refills | Status: DC

## 2017-04-11 NOTE — ED Provider Notes (Signed)
[facility] EMERGENCY DEPARTMENT Provider Note   CSN: [id number] Arrival date & time: 04/10/17  1929     History   Chief Complaint Chief Complaint  Patient presents with  . Flank Pain    HPI Adam AusJeffrey A Anes is a 27 y.o. male.  HPI   Adam Sanders is a 27 y.o. male who presents to the Emergency Department complaining of right lower abdominal and right flank pain that began 1 day prior to arrival.  He describes a aching pain to his right lower abdomen that radiates to his back.  Pain is waxing and waning and intermittently sharp.  Pain is associated with nausea he also states that he feels some urinary hesitancy.  He denies known injury or history of kidney stones.  Pain is not associated with food intake.  No fever, vomiting, pain or swelling of the testicles.  He took oxycodone 15 mg 2 hours prior to arrival with minimal relief.   History reviewed. No pertinent past medical history.  There are no active problems to display for this patient.   Past Surgical History:  Procedure Laterality Date  . FRACTURE SURGERY         Home Medications    Prior to Admission medications   Medication Sig Start Date End Date Taking? Authorizing Provider  amoxicillin (AMOXIL) 500 MG capsule Take 1 capsule (500 mg total) by mouth 3 (three) times daily. 05/23/13   Burgess AmorIdol, Julie, PA-C  HYDROcodone-acetaminophen (NORCO/VICODIN) 5-325 MG tablet Take one tab po q 4 hrs prn pain 04/11/17   Emaley Applin, PA-C  ibuprofen (ADVIL,MOTRIN) 800 MG tablet Take 1 tablet (800 mg total) by mouth 3 (three) times daily. 04/11/17   Sosie Gato, PA-C  traMADol (ULTRAM) 50 MG tablet Take 1 tablet (50 mg total) by mouth every 6 (six) hours as needed for moderate pain. 05/23/13   Burgess AmorIdol, Julie, PA-C    Family History Family History  Problem Relation Age of Onset  . Healthy Mother   . Hypertension Father   . Heart disease Father   . Stroke Father   . Healthy Sister     Social History Social History    Tobacco Use  . Smoking status: Current Every Day Smoker  Substance Use Topics  . Alcohol use: Yes    Comment: occ  . Drug use: Yes     Allergies   Patient has no known allergies.   Review of Systems Review of Systems  Constitutional: Negative for activity change, appetite change, chills and fever.  Respiratory: Negative for chest tightness and shortness of breath.   Cardiovascular: Negative for chest pain.  Gastrointestinal: Positive for abdominal pain and nausea. Negative for vomiting.  Genitourinary: Positive for difficulty urinating. Negative for decreased urine volume, dysuria, flank pain, frequency, hematuria, penile pain, penile swelling, scrotal swelling and urgency.  Musculoskeletal: Positive for back pain.  Skin: Negative for rash.  Neurological: Negative for dizziness, weakness and numbness.  Hematological: Negative for adenopathy.  Psychiatric/Behavioral: Negative for confusion.  All other systems reviewed and are negative.    Physical Exam Updated Vital Signs BP 134/77 (BP Location: Right Arm)   Pulse (!) 109   Temp 98.7 F (37.1 C) (Oral)   Resp 18   Ht 5\' 11"  (1.803 m)   Wt 88.5 kg (195 lb)   SpO2 99%   BMI 27.20 kg/m   Physical Exam  Constitutional: He is oriented to person, place, and time. He appears well-developed and well-nourished. No distress.  HENT:  Head:  Normocephalic and atraumatic.  Mouth/Throat: Oropharynx is clear and moist.  Cardiovascular: Normal rate and regular rhythm.  No murmur heard. Pulmonary/Chest: Effort normal and breath sounds normal. No respiratory distress. He has no wheezes. He has no rales.  Abdominal: Soft. Normal appearance. He exhibits no distension and no mass. There is no hepatosplenomegaly. There is tenderness in the suprapubic area. There is no rigidity, no rebound, no guarding, no CVA tenderness and no tenderness at McBurney's point.  Tenderness to palpation of the right lower quadrant.  No guarding or rebound  tenderness.  No CVA tenderness  Musculoskeletal: Normal range of motion. He exhibits no edema.  Neurological: He is alert and oriented to person, place, and time. No sensory deficit. Coordination normal.  Skin: Skin is warm and dry. Capillary refill takes less than 2 seconds. No rash noted.  Psychiatric: He has a normal mood and affect.  Nursing note and vitals reviewed.    ED Treatments / Results  Labs (all labs ordered are listed, but only abnormal results are displayed) Labs Reviewed  URINALYSIS, ROUTINE W REFLEX MICROSCOPIC - Abnormal; Notable for the following components:      Result Value   Color, Urine AMBER (*)    APPearance HAZY (*)    Specific Gravity, Urine 1.032 (*)    Ketones, ur 5 (*)    Protein, ur 30 (*)    All other components within normal limits  CBC WITH DIFFERENTIAL/PLATELET  COMPREHENSIVE METABOLIC PANEL  LIPASE, BLOOD    EKG  EKG Interpretation None       Radiology Ct Abdomen Pelvis W Contrast  Result Date: 04/10/2017 CLINICAL DATA:  Onset of right lower abdominal pain wrapping around right lower back since yesterday evening. Nausea. EXAM: CT ABDOMEN AND PELVIS WITH CONTRAST TECHNIQUE: Multidetector CT imaging of the abdomen and pelvis was performed using the standard protocol following bolus administration of intravenous contrast. CONTRAST:  ISOVUE-300 IOPAMIDOL (ISOVUE-300) INJECTION 61% COMPARISON:  Noncontrast study from earlier the same day. FINDINGS: Lower chest: No acute abnormality. Hepatobiliary: No focal liver abnormality is seen. No gallstones, gallbladder wall thickening, or biliary dilatation. Pancreas: Unremarkable. No pancreatic ductal dilatation or surrounding inflammatory changes. Spleen: Normal in size without focal abnormality. Adrenals/Urinary Tract: Adrenal glands are unremarkable. Kidneys are normal, without renal calculi, focal lesion, or hydronephrosis. Symmetric enhancement of both kidneys without obstructive uropathy. Bladder  is unremarkable. Stomach/Bowel: Nondistended stomach with normal small bowel rotation. No bowel obstruction. Normal appendix. Some residual enteric contrast is seen within contracted large bowel along the ascending and transverse colon. Vascular/Lymphatic: No significant vascular findings are present. No enlarged abdominal or pelvic lymph nodes. Reproductive: Prostate is unremarkable. Other: No abdominal wall hernia or abnormality. No abdominopelvic ascites. Musculoskeletal: No acute or significant osseous findings. IMPRESSION: Unremarkable contrast-enhanced CT of the abdomen and pelvis. Electronically Signed   By: Tollie Eth M.D.   On: 04/10/2017 23:32   Ct Renal Stone Study  Result Date: 04/10/2017 CLINICAL DATA:  Right lower abdominal pain wrapping around of the right low back beginning yesterday evening around 6 p.m. Nausea. Difficulty urinating. EXAM: CT ABDOMEN AND PELVIS WITHOUT CONTRAST TECHNIQUE: Multidetector CT imaging of the abdomen and pelvis was performed following the standard protocol without IV contrast. COMPARISON:  03/04/2011 FINDINGS: Lower chest: The lung bases are clear. Hepatobiliary: No focal liver abnormality is seen. No gallstones, gallbladder wall thickening, or biliary dilatation. Pancreas: Unremarkable. No pancreatic ductal dilatation or surrounding inflammatory changes. Spleen: Normal in size without focal abnormality. Adrenals/Urinary Tract: Adrenal glands are  unremarkable. Kidneys are normal, without renal calculi, focal lesion, or hydronephrosis. Bladder is decompressed, limiting evaluation. Stomach/Bowel: Stomach, small bowel, and colon are mostly decompressed. Residual contrast material and some residual stool in the colon. No wall thickening or inflammatory infiltration is identified. Appendix is normal. Vascular/Lymphatic: No significant vascular findings are present. No enlarged abdominal or pelvic lymph nodes. Reproductive: Prostate is unremarkable. Other: No abdominal wall  hernia or abnormality. No abdominopelvic ascites. Musculoskeletal: No acute or significant osseous findings. IMPRESSION: 1. No renal or ureteral stone or obstruction. 2. No acute process demonstrated in the abdomen or pelvis on noncontrast imaging. No evidence of bowel obstruction or inflammation. Appendix is normal. Electronically Signed   By: Burman Nieves M.D.   On: 04/10/2017 21:32    Procedures Procedures (including critical care time)  Medications Ordered in ED Medications  oxyCODONE-acetaminophen (PERCOCET/ROXICET) 5-325 MG per tablet 1 tablet (1 tablet Oral Given 04/10/17 2021)  morphine 4 MG/ML injection 4 mg (4 mg Intravenous Given 04/10/17 2314)  ondansetron (ZOFRAN) injection 4 mg (4 mg Intravenous Given 04/10/17 2314)  iopamidol (ISOVUE-300) 61 % injection 100 mL (100 mLs Intravenous Contrast Given 04/10/17 2302)     Initial Impression / Assessment and Plan / ED Course  I have reviewed the triage vital signs and the nursing notes.  Pertinent labs & imaging results that were available during my care of the patient were reviewed by me and considered in my medical decision making (see chart for details).     Both contrast and non-con CTs of the abdomen and pelvis are neg for kidney stone, renal abnormality or appendicitis.  Labs reassuring. Pt states he is hungry and ready for d/c.  I have discussed possibility of early appendicitis and importance of ER return for increasing pain, fever and or vomiting.  Pt agrees to plan  Final Clinical Impressions(s) / ED Diagnoses   Final diagnoses:  Right lower quadrant abdominal pain  Right flank pain    ED Discharge Orders        Ordered    HYDROcodone-acetaminophen (NORCO/VICODIN) 5-325 MG tablet     04/11/17 0004    ibuprofen (ADVIL,MOTRIN) 800 MG tablet  3 times daily     04/11/17 0004       Pauline Aus, PA-C 04/11/17 0031    Mesner, Barbara Cower, MD 04/11/17 1610

## 2017-04-11 NOTE — Discharge Instructions (Signed)
You may apply heat on and off to your lower abdomen.  Follow-up with your primary doctor or return here for any worsening symptoms.

## 2017-07-17 ENCOUNTER — Emergency Department (HOSPITAL_COMMUNITY)
Admission: EM | Admit: 2017-07-17 | Discharge: 2017-07-17 | Disposition: A | Discharge status: Other Acute Inpatient Hospital | Payer: Self-pay | Attending: Emergency Medicine | Admitting: Emergency Medicine

## 2017-07-17 ENCOUNTER — Encounter (HOSPITAL_COMMUNITY): Payer: Self-pay | Admitting: Emergency Medicine

## 2017-07-17 ENCOUNTER — Other Ambulatory Visit: Payer: Self-pay

## 2017-07-17 ENCOUNTER — Encounter (HOSPITAL_COMMUNITY): Payer: Self-pay

## 2017-07-17 ENCOUNTER — Inpatient Hospital Stay (HOSPITAL_COMMUNITY)
Admission: AD | Admit: 2017-07-17 | Discharge: 2017-07-21 | DRG: 885 | Disposition: A | Discharge status: Home / Self Care | Payer: Federal, State, Local not specified - Other | Source: Other Acute Inpatient Hospital | Attending: Psychiatry | Admitting: Psychiatry

## 2017-07-17 DIAGNOSIS — F1390 Sedative, hypnotic, or anxiolytic use, unspecified, uncomplicated: Secondary | ICD-10-CM | POA: Diagnosis not present

## 2017-07-17 DIAGNOSIS — G8929 Other chronic pain: Secondary | ICD-10-CM | POA: Diagnosis present

## 2017-07-17 DIAGNOSIS — R45851 Suicidal ideations: Secondary | ICD-10-CM | POA: Diagnosis present

## 2017-07-17 DIAGNOSIS — F329 Major depressive disorder, single episode, unspecified: Secondary | ICD-10-CM | POA: Insufficient documentation

## 2017-07-17 DIAGNOSIS — F332 Major depressive disorder, recurrent severe without psychotic features: Secondary | ICD-10-CM | POA: Diagnosis present

## 2017-07-17 DIAGNOSIS — F1310 Sedative, hypnotic or anxiolytic abuse, uncomplicated: Secondary | ICD-10-CM | POA: Diagnosis not present

## 2017-07-17 DIAGNOSIS — F1120 Opioid dependence, uncomplicated: Secondary | ICD-10-CM

## 2017-07-17 DIAGNOSIS — F17210 Nicotine dependence, cigarettes, uncomplicated: Secondary | ICD-10-CM | POA: Diagnosis not present

## 2017-07-17 DIAGNOSIS — Z886 Allergy status to analgesic agent status: Secondary | ICD-10-CM | POA: Diagnosis not present

## 2017-07-17 DIAGNOSIS — Z23 Encounter for immunization: Secondary | ICD-10-CM | POA: Diagnosis not present

## 2017-07-17 DIAGNOSIS — F322 Major depressive disorder, single episode, severe without psychotic features: Secondary | ICD-10-CM | POA: Diagnosis not present

## 2017-07-17 DIAGNOSIS — G4700 Insomnia, unspecified: Secondary | ICD-10-CM | POA: Diagnosis present

## 2017-07-17 DIAGNOSIS — Z046 Encounter for general psychiatric examination, requested by authority: Secondary | ICD-10-CM | POA: Insufficient documentation

## 2017-07-17 DIAGNOSIS — F17200 Nicotine dependence, unspecified, uncomplicated: Secondary | ICD-10-CM | POA: Insufficient documentation

## 2017-07-17 DIAGNOSIS — F1910 Other psychoactive substance abuse, uncomplicated: Secondary | ICD-10-CM | POA: Insufficient documentation

## 2017-07-17 DIAGNOSIS — F419 Anxiety disorder, unspecified: Secondary | ICD-10-CM | POA: Diagnosis present

## 2017-07-17 HISTORY — DX: Suicide attempt, initial encounter: T14.91XA

## 2017-07-17 LAB — HEPATIC FUNCTION PANEL
ALBUMIN: 4.4 g/dL (ref 3.5–5.0)
ALT: 31 U/L (ref 17–63)
AST: 28 U/L (ref 15–41)
Alkaline Phosphatase: 90 U/L (ref 38–126)
Bilirubin, Direct: <0.1 mg/dL — ABNORMAL LOW (ref 0.1–0.5)
TOTAL PROTEIN: 7.3 g/dL (ref 6.5–8.1)
Total Bilirubin: 0.5 mg/dL (ref 0.3–1.2)

## 2017-07-17 LAB — CBC WITH DIFFERENTIAL/PLATELET
BASOS ABS: 0 10*3/uL (ref 0.0–0.1)
Basophils Relative: 1 %
EOS PCT: 2 %
Eosinophils Absolute: 0.1 10*3/uL (ref 0.0–0.7)
HEMATOCRIT: 43.1 % (ref 39.0–52.0)
Hemoglobin: 14.5 g/dL (ref 13.0–17.0)
LYMPHS ABS: 1.8 10*3/uL (ref 0.7–4.0)
LYMPHS PCT: 28 %
MCH: 30.9 pg (ref 26.0–34.0)
MCHC: 33.6 g/dL (ref 30.0–36.0)
MCV: 91.7 fL (ref 78.0–100.0)
MONO ABS: 0.4 10*3/uL (ref 0.1–1.0)
MONOS PCT: 6 %
NEUTROS ABS: 4 10*3/uL (ref 1.7–7.7)
Neutrophils Relative %: 63 %
Platelets: 272 10*3/uL (ref 150–400)
RBC: 4.7 MIL/uL (ref 4.22–5.81)
RDW: 12.6 % (ref 11.5–15.5)
WBC: 6.4 10*3/uL (ref 4.0–10.5)

## 2017-07-17 LAB — BASIC METABOLIC PANEL
ANION GAP: 12 (ref 5–15)
BUN: 15 mg/dL (ref 6–20)
CALCIUM: 9.8 mg/dL (ref 8.9–10.3)
CO2: 25 mmol/L (ref 22–32)
Chloride: 105 mmol/L (ref 101–111)
Creatinine, Ser: 0.83 mg/dL (ref 0.61–1.24)
GFR calc Af Amer: >60 mL/min (ref >60)
GFR calc non Af Amer: >60 mL/min (ref >60)
GLUCOSE: 99 mg/dL (ref 65–99)
Potassium: 3.6 mmol/L (ref 3.5–5.1)
Sodium: 142 mmol/L (ref 135–145)

## 2017-07-17 LAB — RAPID URINE DRUG SCREEN, HOSP PERFORMED
Amphetamines: NOT DETECTED
BENZODIAZEPINES: POSITIVE — AB
Barbiturates: NOT DETECTED
Cocaine: NOT DETECTED
Opiates: POSITIVE — AB
Tetrahydrocannabinol: POSITIVE — AB

## 2017-07-17 MED ORDER — NICOTINE POLACRILEX 2 MG MT GUM
2.0000 mg | CHEWING_GUM | OROMUCOSAL | Status: DC | PRN
  Administered 2017-07-17: 2 mg via ORAL
  Filled 2017-07-17: qty 10
  Filled 2017-07-17: qty 1

## 2017-07-17 MED ORDER — LOPERAMIDE HCL 2 MG PO CAPS: 2.0000 mg | ORAL_CAPSULE | ORAL | Status: DC | PRN

## 2017-07-17 MED ORDER — CLONIDINE HCL 0.1 MG PO TABS
0.1000 mg | ORAL_TABLET | Freq: Four times a day (QID) | ORAL | Status: DC
  Administered 2017-07-17 – 2017-07-19 (×9): 0.1 mg via ORAL
  Filled 2017-07-17 (×13): qty 1

## 2017-07-17 MED ORDER — MAGNESIUM HYDROXIDE 400 MG/5ML PO SUSP
30.0000 mL | Freq: Every day | ORAL | Status: DC | PRN
  Administered 2017-07-19: 30 mL via ORAL
  Filled 2017-07-17: qty 30

## 2017-07-17 MED ORDER — HYDROXYZINE HCL 25 MG PO TABS
25.0000 mg | ORAL_TABLET | Freq: Four times a day (QID) | ORAL | Status: DC | PRN
  Administered 2017-07-17 – 2017-07-18 (×2): 25 mg via ORAL
  Filled 2017-07-17 (×2): qty 1

## 2017-07-17 MED ORDER — CLONIDINE HCL 0.1 MG PO TABS
0.1000 mg | ORAL_TABLET | ORAL | Status: DC
  Filled 2017-07-17 (×2): qty 1

## 2017-07-17 MED ORDER — IBUPROFEN 600 MG PO TABS
600.0000 mg | ORAL_TABLET | Freq: Four times a day (QID) | ORAL | Status: DC | PRN
  Administered 2017-07-18 – 2017-07-21 (×10): 600 mg via ORAL
  Filled 2017-07-17 (×10): qty 1

## 2017-07-17 MED ORDER — VITAMIN B-1 100 MG PO TABS
100.0000 mg | ORAL_TABLET | Freq: Every day | ORAL | Status: DC
  Administered 2017-07-17: 100 mg via ORAL
  Filled 2017-07-17: qty 1

## 2017-07-17 MED ORDER — CLONIDINE HCL 0.1 MG PO TABS: 0.1000 mg | ORAL_TABLET | Freq: Every day | ORAL | Status: DC

## 2017-07-17 MED ORDER — LORAZEPAM 1 MG PO TABS
0.0000 mg | ORAL_TABLET | Freq: Two times a day (BID) | ORAL | Status: DC
  Administered 2017-07-17: 1 mg via ORAL

## 2017-07-17 MED ORDER — LORAZEPAM 2 MG/ML IJ SOLN: 0.0000 mg | Freq: Two times a day (BID) | INTRAMUSCULAR | Status: DC

## 2017-07-17 MED ORDER — THIAMINE HCL 100 MG/ML IJ SOLN: 100.0000 mg | Freq: Every day | INTRAMUSCULAR | Status: DC

## 2017-07-17 MED ORDER — LORAZEPAM 1 MG PO TABS
0.0000 mg | ORAL_TABLET | Freq: Four times a day (QID) | ORAL | Status: DC
  Filled 2017-07-17: qty 1

## 2017-07-17 MED ORDER — ACETAMINOPHEN 325 MG PO TABS: 650.0000 mg | ORAL_TABLET | Freq: Four times a day (QID) | ORAL | Status: DC | PRN

## 2017-07-17 MED ORDER — NAPROXEN 500 MG PO TABS: 500.0000 mg | ORAL_TABLET | Freq: Two times a day (BID) | ORAL | Status: DC | PRN

## 2017-07-17 MED ORDER — PNEUMOCOCCAL VAC POLYVALENT 25 MCG/0.5ML IJ INJ
0.5000 mL | INJECTION | INTRAMUSCULAR | Status: AC
  Administered 2017-07-18: 0.5 mL via INTRAMUSCULAR

## 2017-07-17 MED ORDER — METHOCARBAMOL 500 MG PO TABS
500.0000 mg | ORAL_TABLET | Freq: Three times a day (TID) | ORAL | Status: DC | PRN
  Administered 2017-07-18 – 2017-07-21 (×8): 500 mg via ORAL
  Filled 2017-07-17 (×8): qty 1

## 2017-07-17 MED ORDER — ONDANSETRON 4 MG PO TBDP
4.0000 mg | ORAL_TABLET | Freq: Four times a day (QID) | ORAL | Status: DC | PRN
  Administered 2017-07-17: 4 mg via ORAL
  Filled 2017-07-17: qty 1

## 2017-07-17 MED ORDER — ALUM & MAG HYDROXIDE-SIMETH 200-200-20 MG/5ML PO SUSP: 30.0000 mL | ORAL | Status: DC | PRN

## 2017-07-17 MED ORDER — NICOTINE 21 MG/24HR TD PT24
21.0000 mg | MEDICATED_PATCH | Freq: Once | TRANSDERMAL | Status: DC
  Administered 2017-07-17: 21 mg via TRANSDERMAL
  Filled 2017-07-17: qty 1

## 2017-07-17 MED ORDER — LORAZEPAM 2 MG/ML IJ SOLN: 0.0000 mg | Freq: Four times a day (QID) | INTRAMUSCULAR | Status: DC

## 2017-07-17 MED ORDER — DICYCLOMINE HCL 20 MG PO TABS
20.0000 mg | ORAL_TABLET | Freq: Four times a day (QID) | ORAL | Status: DC | PRN
  Administered 2017-07-18: 20 mg via ORAL
  Filled 2017-07-17: qty 1

## 2017-07-17 NOTE — BH Assessment (Addendum)
[facility] Assessment Progress Note  [facility] Attending provider is Dr. Jama Flavorsobos. Call report to [phone]. Neysa Bonitohristy, RN, notified.   Johny ShockSamantha M. Ladona Ridgelaylor, MS, NCC, LPCA Counselor
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
## 2017-07-17 NOTE — ED Notes (Signed)
Family visiting
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
## 2017-07-17 NOTE — ED Notes (Signed)
TTS in progress 

## 2017-07-17 NOTE — ED Notes (Signed)
Pt changed into scrubs  Pts belongings placed in lockers   UA collected   Sitter at bedside

## 2017-07-17 NOTE — ED Notes (Signed)
Patient is crying and upset ...

## 2017-07-17 NOTE — ED Triage Notes (Signed)
[facility] from [facility]. Pt states he tried for 45 min to hang himself yesterday then gave up. Pt staes he took some pills this am not to hurt self but to feel better. Pt took 10 klonipin, 2 hydrocodones, 5 xanaxs. Pt a/o. Nad. Pt states he is si. Denies hi/avh. States has attempted suicide 5 years ago with a gun but changed his mind. Cooperative. mildy anxious and restless

## 2017-07-18 DIAGNOSIS — Z811 Family history of alcohol abuse and dependence: Secondary | ICD-10-CM

## 2017-07-18 DIAGNOSIS — Z62810 Personal history of physical and sexual abuse in childhood: Secondary | ICD-10-CM

## 2017-07-18 DIAGNOSIS — G4700 Insomnia, unspecified: Secondary | ICD-10-CM

## 2017-07-18 DIAGNOSIS — F1120 Opioid dependence, uncomplicated: Secondary | ICD-10-CM

## 2017-07-18 DIAGNOSIS — F1310 Sedative, hypnotic or anxiolytic abuse, uncomplicated: Secondary | ICD-10-CM

## 2017-07-18 DIAGNOSIS — R450 Nervousness: Secondary | ICD-10-CM

## 2017-07-18 DIAGNOSIS — R45851 Suicidal ideations: Secondary | ICD-10-CM

## 2017-07-18 DIAGNOSIS — Z818 Family history of other mental and behavioral disorders: Secondary | ICD-10-CM

## 2017-07-18 DIAGNOSIS — F322 Major depressive disorder, single episode, severe without psychotic features: Principal | ICD-10-CM

## 2017-07-18 DIAGNOSIS — Z62811 Personal history of psychological abuse in childhood: Secondary | ICD-10-CM

## 2017-07-18 DIAGNOSIS — F419 Anxiety disorder, unspecified: Secondary | ICD-10-CM

## 2017-07-18 MED ORDER — SERTRALINE HCL 50 MG PO TABS
50.0000 mg | ORAL_TABLET | Freq: Every day | ORAL | Status: DC
Start: 2017-07-18 — End: 2017-07-22
  Administered 2017-07-18 – 2017-07-21 (×4): 50 mg via ORAL
  Filled 2017-07-18 (×7): qty 1
  Filled 2017-07-18: qty 7

## 2017-07-18 MED ORDER — GABAPENTIN 300 MG PO CAPS
300.0000 mg | ORAL_CAPSULE | Freq: Three times a day (TID) | ORAL | Status: DC
  Administered 2017-07-18 – 2017-07-19 (×3): 300 mg via ORAL
  Filled 2017-07-18 (×9): qty 1

## 2017-07-18 MED ORDER — NICOTINE 21 MG/24HR TD PT24
21.0000 mg | MEDICATED_PATCH | Freq: Every day | TRANSDERMAL | Status: DC
  Administered 2017-07-18 – 2017-07-21 (×4): 21 mg via TRANSDERMAL
  Filled 2017-07-18 (×7): qty 1

## 2017-07-18 MED ORDER — HYDROXYZINE HCL 50 MG PO TABS
50.0000 mg | ORAL_TABLET | Freq: Four times a day (QID) | ORAL | Status: DC | PRN
  Administered 2017-07-18 – 2017-07-21 (×7): 50 mg via ORAL
  Filled 2017-07-18 (×7): qty 1

## 2017-07-18 MED ORDER — TRAZODONE HCL 50 MG PO TABS
50.0000 mg | ORAL_TABLET | Freq: Every evening | ORAL | Status: DC | PRN
  Administered 2017-07-18 – 2017-07-20 (×3): 50 mg via ORAL
  Filled 2017-07-18 (×2): qty 1

## 2017-07-18 NOTE — [facility] Group Notes (Signed)
[facility] Group Notes:  (Nursing/MHT/Case Management/Adjunct)  Date:  07/18/2017  Time:  4:00 pm  Type of Therapy:  Psychoeducational Skills  Participation Level:  Active  Participation Quality:  Appropriate  Affect:  Appropriate  Cognitive:  Appropriate  Insight:  Appropriate  Engagement in Group:  Engaged  Modes of Intervention:  Education  Summary of Progress/Problems: Patient was alert and engaged in group appropriately.    Earline MayotteKnight, Kelin Borum Shephard 07/18/2017, 6:22 PM

## 2017-07-18 NOTE — Progress Notes (Signed)
Pt attended morning goals/ orientation group and stated that his plan is to stop drugs so that he can manage his stress and depression.

## 2017-07-18 NOTE — Progress Notes (Signed)
Nursing Progress Note: 7p-7a D: Pt currently presents with a pleasant/animats/sarcastic/anxious affect and behavior. Pt states "I had a good day. I feel much better. I am thinking about my plan once I get out of here and I am hopeful." Interacting appropriately with the milieu. Pt reports fair sleep during the previous night with current medication regimen. Pt did attend wrap-up group.  A: Pt provided with medications per providers orders. Pt's labs and vitals were monitored throughout the night. Pt supported emotionally and encouraged to express concerns and questions. Pt educated on medications.  R: Pt's safety ensured with 15 minute and environmental checks. Pt currently denies SI, HI, and AVH. Pt verbally contracts to seek staff if SI,HI, or AVH occurs and to consult with staff before acting on any harmful thoughts. Will continue to monitor.
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
## 2017-07-18 NOTE — Tx Team (Signed)
Interdisciplinary Treatment and Diagnostic Plan Update  07/18/2017 Time of Session: 10:00am Adam Sanders MRN: [id number]  Principal Diagnosis: <principal problem not specified>  Secondary Diagnoses: Active Problems:   MDD (major depressive disorder), severe (HCC)   Current Medications:  Current Facility-Administered Medications  Medication Dose Route Frequency Provider Last Rate Last Dose  . alum & mag hydroxide-simeth (MAALOX/MYLANTA) 200-200-20 MG/5ML suspension 30 mL  30 mL Oral Q4H PRN Rankin, [name] B, NP      . cloNIDine (CATAPRES) tablet 0.1 mg  0.1 mg Oral QID Lindon Romp A, NP   0.1 mg at 07/18/17 0802   Followed by  . [START ON 07/20/2017] cloNIDine (CATAPRES) tablet 0.1 mg  0.1 mg Oral BH-qamhs Rozetta Nunnery, NP       Followed by  . [START ON 07/23/2017] cloNIDine (CATAPRES) tablet 0.1 mg  0.1 mg Oral QAC breakfast Lindon Romp A, NP      . dicyclomine (BENTYL) tablet 20 mg  20 mg Oral Q6H PRN Lindon Romp A, NP      . hydrOXYzine (ATARAX/VISTARIL) tablet 25 mg  25 mg Oral Q6H PRN Lindon Romp A, NP   25 mg at 07/17/17 2129  . ibuprofen (ADVIL,MOTRIN) tablet 600 mg  600 mg Oral Q6H PRN Lindon Romp A, NP   600 mg at 07/18/17 0802  . loperamide (IMODIUM) capsule 2-4 mg  2-4 mg Oral PRN Lindon Romp A, NP      . magnesium hydroxide (MILK OF MAGNESIA) suspension 30 mL  30 mL Oral Daily PRN Rankin, [name] B, NP      . methocarbamol (ROBAXIN) tablet 500 mg  500 mg Oral Q8H PRN Lindon Romp A, NP      . nicotine (NICODERM CQ - dosed in mg/24 hours) patch 21 mg  21 mg Transdermal Daily Lindon Romp A, NP   21 mg at 07/18/17 0803  . ondansetron (ZOFRAN-ODT) disintegrating tablet 4 mg  4 mg Oral Q6H PRN Lindon Romp A, NP   4 mg at 07/17/17 2129  . pneumococcal 23 valent vaccine (PNU-IMMUNE) injection 0.5 mL  0.5 mL Intramuscular Tomorrow-1000 Lindon Romp A, NP       PTA Medications: Medications Prior to Admission  Medication Sig Dispense Refill Last Dose  . ibuprofen  (ADVIL,MOTRIN) 800 MG tablet Take 1 tablet (800 mg total) by mouth 3 (three) times daily. (Patient not taking: Reported on 07/17/2017) 21 tablet 0 Not Taking at Unknown time    Patient Stressors: Financial difficulties Loss of "pap" - grandpa Marital or family conflict Substance abuse  Patient Strengths: Ability for insight Active sense of humor Capable of independent living Communication skills  Treatment Modalities: Medication Management, Group therapy, Case management,  1 to 1 session with clinician, Psychoeducation, Recreational therapy.   Physician Treatment Plan for Primary Diagnosis: <principal problem not specified> Long Term Goal(s):     Short Term Goals:    Medication Management: Evaluate patient's response, side effects, and tolerance of medication regimen.  Therapeutic Interventions: 1 to 1 sessions, Unit Group sessions and Medication administration.  Evaluation of Outcomes: Not Met  Physician Treatment Plan for Secondary Diagnosis: Active Problems:   MDD (major depressive disorder), severe ([place])  Long Term Goal(s):     Short Term Goals:       Medication Management: Evaluate patient's response, side effects, and tolerance of medication regimen.  Therapeutic Interventions: 1 to 1 sessions, Unit Group sessions and Medication administration.  Evaluation of Outcomes: Not Met   RN Treatment Plan for Primary  Diagnosis: <principal problem not specified> Long Term Goal(s): Knowledge of disease and therapeutic regimen to maintain health will improve  Short Term Goals: Ability to remain free from injury will improve, Ability to verbalize frustration and anger appropriately will improve, Ability to demonstrate self-control, Ability to participate in decision making will improve, Ability to verbalize feelings will improve, Ability to disclose and discuss suicidal ideas, Ability to identify and develop effective coping behaviors will improve and Compliance with prescribed  medications will improve  Medication Management: RN will administer medications as ordered by provider, will assess and evaluate patient's response and provide education to patient for prescribed medication. RN will report any adverse and/or side effects to prescribing provider.  Therapeutic Interventions: 1 on 1 counseling sessions, Psychoeducation, Medication administration, Evaluate responses to treatment, Monitor vital signs and CBGs as ordered, Perform/monitor CIWA, COWS, AIMS and Fall Risk screenings as ordered, Perform wound care treatments as ordered.  Evaluation of Outcomes: Not Met   LCSW Treatment Plan for Primary Diagnosis: <principal problem not specified> Long Term Goal(s): Safe transition to appropriate next level of care at discharge, Engage patient in therapeutic group addressing interpersonal concerns.  Short Term Goals: Engage patient in aftercare planning with referrals and resources, Increase social support, Increase ability to appropriately verbalize feelings, Increase emotional regulation, Facilitate acceptance of mental health diagnosis and concerns, Facilitate patient progression through stages of change regarding substance use diagnoses and concerns, Identify triggers associated with mental health/substance abuse issues and Increase skills for wellness and recovery  Therapeutic Interventions: Assess for all discharge needs, 1 to 1 time with Social worker, Explore available resources and support systems, Assess for adequacy in community support network, Educate family and significant other(s) on suicide prevention, Complete Psychosocial Assessment, Interpersonal group therapy.  Evaluation of Outcomes: Not Met   Progress in Treatment: Attending groups: No. New to unit  Participating in groups: No. Taking medication as prescribed: Yes. Toleration medication: Yes. Family/Significant other contact made: No, will contact:  CSW will continue to assess for an appropriate  collateral contact Patient understands diagnosis: Yes. Discussing patient identified problems/goals with staff: Yes. Medical problems stabilized or resolved: Yes. Denies suicidal/homicidal ideation: Yes. Issues/concerns per patient self-inventory: No. Other:  New problem(s) identified: None  New Short Term/Long Term Goal(s): Detox, medication stabilization, elimination of SI thoughts, development of comprehensive mental wellness plan.    Patient Goals: "I want to detox, get clean and get help with my depression to better myself"  Discharge Plan or Barriers: CSW will continue to assess to formulate an appropriate discharge plan.   Reason for Continuation of Hospitalization: Anxiety Depression Medication stabilization  Estimated Length of Stay: Monday, 07/21/17  Attendees: Patient: Adam Sanders 07/18/2017 11:16 AM  Physician: Dr. Nancy Fetter, MD 07/18/2017 11:16 AM  Nursing: Nicoletta Dress RN 07/18/2017 11:16 AM  RN Care Manager: Rhunette Croft 07/18/2017 11:16 AM  Social Worker: Radonna Ricker, [place] 07/18/2017 11:16 AM  Recreational Therapist: X 07/18/2017 11:16 AM  Other: x 07/18/2017 11:16 AM  Other: X 07/18/2017 11:16 AM  Other:X 07/18/2017 11:16 AM    Scribe for Treatment Team: Marylee Floras, [place] 07/18/2017 11:16 AM

## 2017-07-18 NOTE — Progress Notes (Signed)
D: Pt A & O X4. Presented tearful and depressed this AM on initial contact. Denies SI, HI and AVH "not right now". However, pt has been intrusive as shift progressed. Multiple redirections offered to prevent verbal altercations with peers and was effective. Rates his depression 8/10, hopelessness 2/10 and anxiety 10/10 on self inventory sheet "I'll use my coping skills" when approached. Reports he slept well last night with good appetite. Pt's goal for today "getting the help I need to stay clean and not be depressed".  A: Scheduled and PRN medications given as per MD's order with verbal education and effects monitored. Emotional support and availability provided to pt. Encouraged pt to voice concerns. Writer updated pt on changes made to treatment regimen (new medications, etc). Safety checks maintained without self harm gestures or outburst thus far.  R: Pt receptive to care. Took all medications without issues. Denies adverse drug reactions at this time. Tolerates all PO intake well. POC continues for safety and mood stability.

## 2017-07-18 NOTE — [facility] Group Notes (Signed)
LCSW Group Therapy Note 07/18/2017 3:04 PM  Type of Therapy and Topic: Group Therapy: Feelings around Relapse and Recovery  Participation Level: Did Not Attend   Description of Group:  Patients in this group will discuss emotions they experience before and after a relapse. They will process how experiencing these feelings, or avoidance of experiencing them, relates to having a relapse. Facilitator will guide patients to explore emotions they have related to recovery. Patients will be encouraged to process which emotions are more powerful. They will be guided to discuss the emotional reaction significant others in their lives may have to their relapse or recovery. Patients will be assisted in exploring ways to respond to the emotions of others without this contributing to a relapse.  Therapeutic Goals: 1. Patient will identify two or more emotions that lead to a relapse for them 2. Patient will identify two emotions that result when they relapse 3. Patient will identify two emotions related to recovery 4. Patient will demonstrate ability to communicate their needs through discussion and/or role plays  Summary of Patient Progress:  Invited, chose not to attend.    Therapeutic Modalities:  Cognitive Behavioral Therapy Solution-Focused Therapy Assertiveness Training Relapse Prevention Therapy   Alcario DroughtJolan Duey Liller LCSWA Clinical Social Worker
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
## 2017-07-18 NOTE — B[facility] Suicide Risk Assessment (Signed)
[facility] INPATIENT:  Family/Significant Other Suicide Prevention Education  Suicide Prevention Education:  Contact Attempts: Mom, Ellison CarwinDonna Hartfield [phone]) and/or Junius Finnergrandma, Pat Hartfield [phone]) have been identified by the patient as the family member/significant other with whom the patient will be residing, and identified as the person(s) who will aid the patient in the event of a mental health crisis.  With written consent from the patient, two attempts were made to provide suicide prevention education, prior to and/or following the patient's discharge.  We were unsuccessful in providing suicide prevention education.  A suicide education pamphlet was given to the patient to share with family/significant other.  Date and time of first attempt: 07/18/17 11:35am  Writer tried both phone numbers, neither had the option to leave a voicemail.  Adam McleanCharlotte C Luddie Sanders 07/18/2017, 11:40 AM

## 2017-07-18 NOTE — [facility] Suicide Risk Assessment (Signed)
[facility] Ad[facility]icide [facility]   Nursing information obtained from:    Demographic factors:    Current Mental Status:    Loss Factors:    Historical Factors:    Risk Reduction Factors:     Total Time spent with patient: 1 hour Principal Problem: MDD (major depressive disorder), severe (HCC) Diagnosis:   Patient Active Problem List   Diagnosis Date Noted  . Opioid use disorder, severe, dependence (HCC) [F11.20] 07/18/2017  . MDD (major depressive disorder), severe (HCC) [F32.2] 07/17/2017   Subjective Data: See H&P for details  Continued Clinical Symptoms:    The "Alcohol Use Disorders Identification Test", Guidelines for Use in Primary Care, Second Edition.  World Science writerHealth Organization [facility](WHO). Score between 0-7:  no or low risk or alcohol related problems. Score between 8-15:  moderate risk of alcohol related problems. Score between 16-19:  high risk of alcohol related problems. Score 20 or above:  warrants further diagnostic evaluation for alcohol dependence and treatment.   CLINICAL FACTORS:   Severe Anxiety and/or Agitation Depression:   Comorbid alcohol abuse/dependence Severe Alcohol/Substance Abuse/Dependencies Chronic Pain Unstable or Poor Therapeutic Relationship Medical Diagnoses and Treatments/Surgeries  Psychiatric Specialty Exam: Physical Exam  Nursing note and vitals reviewed.   ROS- See H&P for details  Blood pressure 125/86, pulse (!) 194, temperature 97.8 F (36.6 C), temperature source Oral, resp. rate 16, height 5\' 11"  (1.803 m), weight 86.2 kg (190 lb), SpO2 100 %.Body mass index is 26.5 kg/m.    COGNITIVE FEATURES THAT CONTRIBUTE TO RISK:  None    SUICIDE RISK:   Moderate:  Frequent suicidal ideation with limited intensity, and duration, some specificity in terms of plans, no associated intent, good self-control, limited dysphoria/symptomatology, some risk factors present, and identifiable protective factors, including available and  accessible social support.  PLAN OF CARE: See H&P for details  I certify that inpatient services furnished can reasonably be expected to improve the patient's condition.   Micheal Likenshristopher T Dhaval Woo, MD 07/18/2017, 3:21 PM

## 2017-07-18 NOTE — Progress Notes (Signed)
Recreation Therapy Notes  Date: 4.12.19 Time: 9:30 a.m. Location: [address] Dayroom   Group Topic: Stress Management   Goal Area(s) Addresses:  Goal 1.1: To reduce stress  -Patient will feel a reduction in stress level  -Patient will understand the importance of stress management  -Patient will participate during stress management group   Behavioral Response: Engaged   Intervention: Stress Management   Activity: Meditation- Patients were in a peaceful environment with soft lighting enhancing patients mood. Patients listened to a body scan meditation on the calm app to help decrease tension and stress levels   Education: Stress Management, Discharge Planning.    Education Outcome: Acknowledges edcuation/In group clarification offered/Needs additional education   Clinical Observations/Feedback:: Patient attended and participated appropriately during stress management group treatment.    Adam Sanders, Recreation Therapy Intern   Adam Sanders 07/18/2017 8:40 AM

## 2017-07-19 DIAGNOSIS — F1390 Sedative, hypnotic, or anxiolytic use, unspecified, uncomplicated: Secondary | ICD-10-CM

## 2017-07-19 DIAGNOSIS — F1290 Cannabis use, unspecified, uncomplicated: Secondary | ICD-10-CM

## 2017-07-19 DIAGNOSIS — F17210 Nicotine dependence, cigarettes, uncomplicated: Secondary | ICD-10-CM

## 2017-07-19 MED ORDER — GABAPENTIN 400 MG PO CAPS
400.0000 mg | ORAL_CAPSULE | Freq: Three times a day (TID) | ORAL | Status: DC
  Administered 2017-07-19 – 2017-07-21 (×8): 400 mg via ORAL
  Filled 2017-07-19 (×2): qty 1
  Filled 2017-07-19: qty 21
  Filled 2017-07-19 (×4): qty 1
  Filled 2017-07-19: qty 21
  Filled 2017-07-19 (×2): qty 1
  Filled 2017-07-19: qty 21
  Filled 2017-07-19: qty 1

## 2017-07-19 NOTE — [facility] Group Notes (Signed)
[facility] Group Notes:  (Nursing)  Date:  07/19/2017  Time:  1:15 PM Type of Therapy:  Nurse Education  Participation Level:  Active  Participation Quality:  Appropriate and Attentive  Affect:  Appropriate  Cognitive:  Alert and Appropriate  Insight:  Appropriate and Good  Engagement in Group:  Engaged  Modes of Intervention:  Discussion, Education and Rapport Building  Summary of Progress/Problems: Patient was actively engaged in nurse led group. Group played a non competitive learning/communication board game that fosters listening skills as well as self expression.  Shela NevinValerie S Burnice Oestreicher 07/19/2017, 2:55 PM

## 2017-07-19 NOTE — [facility] Group Notes (Signed)
LCSW Group Therapy Note 07/19/2017 1:22 PM  Type of Therapy/Topic: Group Therapy: Emotion Regulation  Participation Level: Active   Description of Group:  The purpose of this group is to assist patients in learning to regulate negative emotions and experience positive emotions. Patients will be guided to discuss ways in which they have been vulnerable to their negative emotions. These vulnerabilities will be juxtaposed with experiences of positive emotions or situations, and patients will be challenged to use positive emotions to combat negative ones. Special emphasis will be placed on coping with negative emotions in conflict situations, and patients will process healthy conflict resolution skills.  Therapeutic Goals: 1. Patient will identify two positive emotions or experiences to reflect on in order to balance out negative emotions 2. Patient will label two or more emotions that they find the most difficult to experience 3. Patient will demonstrate positive conflict resolution skills through discussion and/or role plays  Summary of Patient Progress:  Adam Sanders was engaged throughout and contributed to the group discussion regarding anger management. Adam Sanders reports that when he gets angry he often hit and break things. Adam Sanders states that he realize that he cannot continue to express his emotions in that manner and plans to learn better coping skills while he is in the hospital.    Therapeutic Modalities:  Cognitive Behavioral Therapy Feelings Identification Dialectical Behavioral Therapy   Adam Sanders LCSWA Clinical Social Worker
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
## 2017-07-19 NOTE — Progress Notes (Signed)
B[facility] Group Notes:  (Nursing/MHT/Case Management/Adjunct)  Date:  07/19/2017  Time:  2030 Type of Therapy:  wrap up group  Participation Level:  Active  Participation Quality:  Appropriate, Attentive, Sharing and Supportive  Affect:  Depressed  Cognitive:  Appropriate  Insight:  Improving  Engagement in Group:  Engaged  Modes of Intervention:  Clarification, Education and Support  Summary of Progress/Problems: Pt reported being in a good mood today without cravings. If patient could change any one thing about his life it would be his bad decisions. Pt is grateful for his family and friends. Pt is jovial with peers and makes them laugh but presents with depressed affect.   Marcille BuffyMcNeil, Amiir Heckard S 07/19/2017, 9:57 PM

## 2017-07-19 NOTE — Progress Notes (Signed)
D. Pt presents with an anxious affect with congruent mood- Pt is pleasant and cooperative during interactions. Pt observed interacting appropriately in dayroom with peers. Pt reports having slept well last night, endorsing normal energy and good concentration. Per pt's self inventory, pt rates his depression, hopelessness and anxiety a 0/0/8, respectively.  Pt writes that his most important goal today is "being a better person, pray more", "be positive".  Pt currently denies SI/HI and AV hallucinations. A. Labs and vitals monitored. Pt compliant with  medications. Pt supported emotionally and encouraged to express concerns and ask questions.   R. Pt remains safe with 15 minute checks. Will continue POC.
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
## 2017-07-19 NOTE — Plan of Care (Signed)
  Problem: Coping: Goal: Ability to demonstrate self-control will improve Outcome: Progressing   Problem: Physical Regulation: Goal: Ability to maintain clinical measurements within normal limits will improve Outcome: Progressing   Problem: Safety: Goal: Periods of time without injury will increase Outcome: Progressing   

## 2017-07-20 MED ORDER — BISACODYL 5 MG PO TBEC: 5.0000 mg | DELAYED_RELEASE_TABLET | Freq: Every day | ORAL | Status: DC | PRN

## 2017-07-20 MED ORDER — TRAZODONE HCL 50 MG PO TABS
50.0000 mg | ORAL_TABLET | Freq: Every evening | ORAL | Status: DC | PRN
  Administered 2017-07-20: 50 mg via ORAL
  Filled 2017-07-20: qty 1
  Filled 2017-07-20: qty 14

## 2017-07-20 NOTE — [facility] Group Notes (Signed)
[facility] Group Notes:  (Nursing/MHT/Case Management/Adjunct)  Date:  07/20/2017  Time:  1:15 pm  Type of Therapy:  Psychoeducational Skills  Participation Level:  Active  Participation Quality:  Appropriate  Affect:  Appropriate  Cognitive:  Appropriate  Insight:  Appropriate  Engagement in Group:  Engaged  Modes of Intervention:  Discussion  Summary of Progress/Problems: Patient as alert and active in group.  Quintella ReichertKnight, Cleston Lautner Cissna ParkShephard 07/20/2017, 3:08 PM
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
## 2017-07-20 NOTE — [facility] Group Notes (Signed)
[facility] LCSW Group Therapy Note  Date/Time: 07/20/17, 0900  Type of Therapy/Topic:  Group Therapy:  Feelings about Diagnosis  Participation Level:  Active   Mood:pleasant   Description of Group:    This group will allow patients to explore their thoughts and feelings about diagnoses they have received. Patients will be guided to explore their level of understanding and acceptance of these diagnoses. Facilitator will encourage patients to process their thoughts and feelings about the reactions of others to their diagnosis, and will guide patients in identifying ways to discuss their diagnosis with significant others in their lives. This group will be process-oriented, with patients participating in exploration of their own experiences as well as giving and receiving support and challenge from other group members.   Therapeutic Goals: 1. Patient will demonstrate understanding of diagnosis as evidence by identifying two or more symptoms of the disorder:  2. Patient will be able to express two feelings regarding the diagnosis 3. Patient will demonstrate ability to communicate their needs through discussion and/or role plays  Summary of Patient Progress:Pt was active in discussion of symptoms of several diagnoses.  Pt made a number of good comments and reported at the end that he is in agreement with his current diagnosis.         Therapeutic Modalities:   Cognitive Behavioral Therapy Brief Therapy Feelings Identification   Daleen SquibbGreg Chaska Hagger, LCSW

## 2017-07-20 NOTE — Plan of Care (Signed)
  Problem: Coping: Goal: Ability to verbalize frustrations and anger appropriately will improve Outcome: Progressing Goal: Ability to demonstrate self-control will improve Outcome: Progressing   

## 2017-07-20 NOTE — Progress Notes (Signed)
D    Pt requested vistaril and trazadone  And clonidine    He admitted he told the doctor he didn't need the clonidine but he has since changed his mind and said the doctor told hime if he needed it he could still get it   Pt was told the doctor discontinued his clonidine and was given vistaril    A   Verbal support given   Medications administered and effectiveness monitored    Q 15 min checks R   Pt remains safe at present time

## 2017-07-20 NOTE — Progress Notes (Signed)
D. Pt presents with an anxious affect with congruent behavior- pleasant upon interactions. Pt declined clonidine this am, reporting on mild anxiety. Pt observed interacting appropriately with peers in milieu, attending groups. Pt currently denies SI/HI and AV hallucinations. A. Labs and vitals monitored. Pt compliant with medications. Pt supported emotionally and encouraged to express concerns and ask questions.   R. Pt remains safe with 15 minute checks. Will continue POC.

## 2017-07-21 DIAGNOSIS — F322 Major depressive disorder, single episode, severe without psychotic features: Secondary | ICD-10-CM

## 2017-07-21 MED ORDER — TRAZODONE HCL 50 MG PO TABS: 50.0000 mg | ORAL_TABLET | Freq: Every evening | ORAL | 0 refills | Status: AC | PRN

## 2017-07-21 MED ORDER — SERTRALINE HCL 50 MG PO TABS: 50.0000 mg | ORAL_TABLET | Freq: Every day | ORAL | 0 refills | Status: AC

## 2017-07-21 MED ORDER — GABAPENTIN 400 MG PO CAPS: 400.0000 mg | ORAL_CAPSULE | Freq: Three times a day (TID) | ORAL | 0 refills | Status: AC

## 2017-07-21 MED ORDER — NICOTINE 21 MG/24HR TD PT24: 21.0000 mg | MEDICATED_PATCH | Freq: Every day | TRANSDERMAL | 0 refills | Status: DC

## 2017-07-21 NOTE — Progress Notes (Signed)
  [facility] :  Will you be returning to the same living situation after discharge:  Yes,  patient reports he is returning home [facility]. At discharge, do you have transportation home?: Yes,  patient reports his grandmother will pick him up at discharge Do you have the ability to pay for your medications: No.  Release of information consent forms completed and in the chart;  Patient's signature needed at discharge.  Patient to Follow up at: Follow-up Information    [facility] Follow up.   Specialty:  Addiction Medicine Why:  There are no current beds at the facility. Please call on a daily basis for potential beds and admission. CSW unable to place the patient at the facility prior to the patient's discharge.  Contact information: [address] [place] [address] [phone]        Services, [facility]. Go on 07/23/2017.   Why:  Appointment is 07/23/17 at 8:00am. Please be sure to bring your Photo ID, SSN, any insurance information and any dicharge paperwork from the hospital.  Contact information: [address] [place] [id number] [phone]           Next level of care provider [facility]:yes  Safety Planning and Suicide Prevention discussed: Yes,  with the patient's mother  Have you used any form of tobacco in the last 30 days? (Cigarettes, Smokeless Tobacco, Cigars, and/or Pipes): Yes  Has patient been referred to the Quitline?: Patient refused referral  Patient has been referred for addiction treatment: Yes  Maeola SarahJolan E Arisbeth Purrington, LCSWA 07/21/2017, 11:45 AM

## 2017-07-21 NOTE — [facility] Suicide Risk Assessment (Signed)
[facility] INPATIENT:  Family/Significant Other Suicide Prevention Education  Suicide Prevention Education:  Education Completed; Adam Sanders, mother [phone]) has been identified by the patient as the family member/significant other with whom the patient will be residing, and identified as the person(s) who will aid the patient in the event of a mental health crisis (suicidal ideations/suicide attempt).  With written consent from the patient, the family member/significant other has been provided the following suicide prevention education, prior to the and/or following the discharge of the patient.  The suicide prevention education provided includes the following:  Suicide risk factors  Suicide prevention and interventions  National Suicide Hotline telephone number  [facility] assessment telephone number  [facility] Emergency Assistance 911  [facility] and/or Residential Mobile Crisis Unit telephone number  Request made of family/significant other to:  Remove weapons (e.g., guns, rifles, knives), all items previously/currently identified as safety concern.    Remove drugs/medications (over-the-counter, prescriptions, illicit drugs), all items previously/currently identified as a safety concern.  The family member/significant other verbalizes understanding of the suicide prevention education information provided.  The family member/significant other agrees to remove the items of safety concern listed above.  Adam Sanders 07/21/2017, 11:28 AM

## 2017-07-21 NOTE — Progress Notes (Signed)
Pt d/c from the hospital with a bus pass. All items returned. D/c instructions given, prescriptions given and samples given. Pt denies si and hi.

## 2017-07-21 NOTE — [facility] Suicide Risk Assessment (Signed)
[facility] Discharge Suicide Risk Assessment   Principal Problem: MDD (major depressive disorder), severe [facility]) Discharge Diagnoses:  Patient Active Problem List   Diagnosis Date Noted  . Opioid use disorder, severe, dependence (HCC) [F11.20] 07/18/2017  . MDD (major depressive disorder), severe (HCC) [F32.2] 07/17/2017    Total Time spent with patient: 30 minutes  Musculoskeletal: Strength & Muscle Tone: within normal limits Gait & Station: normal Patient leans: N/A  Psychiatric Specialty Exam: Review of Systems  Constitutional: Negative for chills and fever.    Blood pressure 135/78, pulse (!) 115, temperature 97.7 F (36.5 C), temperature source Oral, resp. rate 16, height 5\' 11"  (1.803 m), weight 86.2 kg (190 lb), SpO2 99 %.Body mass index is 26.5 kg/m.  General Appearance: Casual and Fairly Groomed  Patent attorney::  Good  Speech:  Clear and Coherent and Normal Rate  Volume:  Normal  Mood:  Euthymic  Affect:  Appropriate and Congruent  Thought Process:  Coherent and Goal Directed  Orientation:  Full (Time, Place, and Person)  Thought Content:  Logical  Suicidal Thoughts:  No  Homicidal Thoughts:  No  Memory:  Immediate;   Fair Recent;   Fair Remote;   Fair  Judgement:  Fair  Insight:  Fair  Psychomotor Activity:  Normal  Concentration:  Good  Recall:  [employer] of Knowledge:Fair  Language: Fair  Akathisia:  No  Handed:    AIMS (if indicated):     Assets:  Communication Skills Resilience Social Support  Sleep:  Number of Hours: 6.25  Cognition: WNL  ADL's:  Intact   Mental Status Per Nursing Assessment::   On Admission:     Demographic Factors:  Male, Low socioeconomic status and Unemployed  Loss Factors: Financial problems/change in socioeconomic status  Historical Factors: Impulsivity  Risk Reduction Factors:   Positive social support, Positive therapeutic relationship and Positive coping skills or problem solving skills  Continued Clinical  Symptoms:  Severe Anxiety and/or Agitation Depression:   Comorbid alcohol abuse/dependence Alcohol/Substance Abuse/Dependencies  Cognitive Features That Contribute To Risk:  None    Suicide Risk:  Minimal: No identifiable suicidal ideation.  Patients presenting with no risk factors but with morbid ruminations; may be classified as minimal risk based on the severity of the depressive symptoms  Follow-up Information    [facility] Follow up.   Specialty:  Addiction Medicine Why:  There are no current beds at the facility. Please call on a daily basis for potential beds and admission. CSW unable to place the patient at the facility prior to the patient's discharge.  Contact information: [address] [place] [address] [phone]        [facility]. Go on 07/23/2017.   Why:  Appointment is 07/23/17 at 8:00am. Please be sure to bring your Photo ID, SSN, any insurance information and any dicharge paperwork from the hospital.  Contact information: [address] [place] [address] [phone]         Subjective Data: Adam Sanders is a 27 y/o M with no psychiatric history who was admitted voluntarily from [facility] ED when he presented with flank pain and also reported that he had been making preparations to hang himself, but was able to stop himself and present to ED for help. Pt endorsed worsening depression, SI with plan, and worsening use of opiates and benzodiazepines. He was transferred to [facility] for additional treatment and stabilization. Pt was started on opiate withdrawal protocol with clonidine and  zoloft to address mood symptoms. He had gradual improvement of mood symptoms. He was in agreement to be [facility].  Today upon evaluation, pt shares, "I'm doing good - I just want to go home and see my family. I'm not withdrawing. I'm not craving or anything. I'll be able to get myself to  [facility]." Pt denies SI/HI/AH/VH. He is sleeping well. His appetite is good. He denies physical complaints. He is in agreement to continue his current treatment regimen without changes. He was able to engage in safety planning including [facility] or contact emergency services if he feels unable to maintain his own safety or the safety of others. Pt had no further questions, comments, or concerns.   Plan Of Care/Follow-up recommendations:   -Discharge to outpatient level of care  -MDD, recurrent, severe, without psychosis   - Continue zoloft 50mg  po qDay  - Anxiety   -Continue Neurontin 400 mg po TID   -Continue Vistaril 50 mg p.o. every 6 hours as needed for anxiety  -Insomnia   -Continue trazodone 50 mg po qhs prn insomnia  -Opiate use disorder   - Pt will follow up with [facility] after discharge  Activity:  as tolerated Diet:  normal Tests:  NA Other:  see above for DC plan  Adam Likenshristopher T Kambree Krauss, MD 07/21/2017, 2:19 PM

## 2017-07-21 NOTE — Progress Notes (Signed)
Recreation Therapy Notes  Date: 4.15.19 Time: 9:30 a.m. Location: [address] Dayroom   Group Topic: Stress Management   Goal Area(s) Addresses:  Goal 1.1: To reduce stress  -Patient will feel a reduction in stress level  -Patient will understand the importance of stress management  -Patient will participate during stress management group   Behavioral Response: Engaged   Intervention: Stress Management   Activity: Guided Imagery: Patients were in a peaceful environment with soft lighting enhancing patients mood. Patients listened to a guided imagery script read by Recreation Therapy Intern.  Education: Stress Management, Discharge Planning.    Education Outcome: Acknowledges edcuation/In group clarification offered/Needs additional education   Clinical Observations/Feedback:: Patient attended and participated appropriately during stress management group treatment.    Sheryle Hailarian Denetta Fei, Recreation Therapy Intern   Sheryle HailDarian Willia Genrich 07/21/2017 9:04 AM
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
## 2017-08-05 ENCOUNTER — Emergency Department (HOSPITAL_COMMUNITY)
Admission: EM | Admit: 2017-08-05 | Discharge: 2017-08-06 | Disposition: A | Discharge status: Home / Self Care | Payer: Self-pay | Attending: Emergency Medicine | Admitting: Emergency Medicine

## 2017-08-05 ENCOUNTER — Encounter (HOSPITAL_COMMUNITY): Payer: Self-pay

## 2017-08-05 ENCOUNTER — Emergency Department (HOSPITAL_COMMUNITY): Payer: Self-pay

## 2017-08-05 DIAGNOSIS — Z5321 Procedure and treatment not carried out due to patient leaving prior to being seen by health care provider: Secondary | ICD-10-CM | POA: Insufficient documentation

## 2017-08-05 DIAGNOSIS — R079 Chest pain, unspecified: Secondary | ICD-10-CM | POA: Insufficient documentation

## 2017-08-05 NOTE — ED Triage Notes (Signed)
Pt complains of left sided chest pain that radiates through the center of his back He states its a sharp pain and hurts when he takes a deep breath Pt ate a stromboli tonight and that's when the pain started Pt took a zantac and an aspirin with some relief

## 2017-08-06 LAB — BASIC METABOLIC PANEL
Anion gap: 12 (ref 5–15)
BUN: 18 mg/dL (ref 6–20)
CO2: 28 mmol/L (ref 22–32)
CREATININE: 1.02 mg/dL (ref 0.61–1.24)
Calcium: 9.7 mg/dL (ref 8.9–10.3)
Chloride: 99 mmol/L — ABNORMAL LOW (ref 101–111)
GFR calc Af Amer: >60 mL/min (ref >60)
Glucose, Bld: 80 mg/dL (ref 65–99)
Potassium: 3 mmol/L — ABNORMAL LOW (ref 3.5–5.1)
SODIUM: 139 mmol/L (ref 135–145)

## 2017-08-06 LAB — CBC
HCT: 47.7 % (ref 39.0–52.0)
Hemoglobin: 16.7 g/dL (ref 13.0–17.0)
MCH: 32 pg (ref 26.0–34.0)
MCHC: 35 g/dL (ref 30.0–36.0)
MCV: 91.4 fL (ref 78.0–100.0)
PLATELETS: 311 10*3/uL (ref 150–400)
RBC: 5.22 MIL/uL (ref 4.22–5.81)
RDW: 12.8 % (ref 11.5–15.5)
WBC: 10.5 10*3/uL (ref 4.0–10.5)

## 2017-08-06 LAB — I-STAT TROPONIN, ED: Troponin i, poc: 0.02 ng/mL (ref 0.00–0.08)

## 2017-08-06 NOTE — ED Notes (Signed)
Pt no longer seen in the lobby

## 2017-08-06 NOTE — ED Triage Notes (Signed)
Pt called from triage with no answer 

## 2017-10-16 ENCOUNTER — Encounter (HOSPITAL_COMMUNITY): Payer: Self-pay | Admitting: *Deleted

## 2017-10-16 ENCOUNTER — Emergency Department (HOSPITAL_COMMUNITY)
Admission: EM | Admit: 2017-10-16 | Discharge: 2017-10-16 | Disposition: A | Discharge status: Home / Self Care | Payer: Self-pay | Attending: Emergency Medicine | Admitting: Emergency Medicine

## 2017-10-16 ENCOUNTER — Other Ambulatory Visit: Payer: Self-pay

## 2017-10-16 DIAGNOSIS — F1910 Other psychoactive substance abuse, uncomplicated: Secondary | ICD-10-CM | POA: Insufficient documentation

## 2017-10-16 DIAGNOSIS — F329 Major depressive disorder, single episode, unspecified: Secondary | ICD-10-CM | POA: Insufficient documentation

## 2017-10-16 DIAGNOSIS — Z79899 Other long term (current) drug therapy: Secondary | ICD-10-CM | POA: Insufficient documentation

## 2017-10-16 HISTORY — DX: Other psychoactive substance abuse, uncomplicated: F19.10

## 2017-10-16 HISTORY — DX: Personal history of (healed) traumatic fracture: Z87.81

## 2017-10-16 LAB — RAPID URINE DRUG SCREEN, HOSP PERFORMED
Amphetamines: NOT DETECTED
Benzodiazepines: POSITIVE — AB
COCAINE: POSITIVE — AB
Opiates: POSITIVE — AB
TETRAHYDROCANNABINOL: POSITIVE — AB

## 2017-10-16 LAB — CBC WITH DIFFERENTIAL/PLATELET
Basophils Absolute: 0 10*3/uL (ref 0.0–0.1)
Basophils Relative: 1 %
Eosinophils Absolute: 0.2 10*3/uL (ref 0.0–0.7)
Eosinophils Relative: 2 %
HEMATOCRIT: 47.2 % (ref 39.0–52.0)
HEMOGLOBIN: 16.3 g/dL (ref 13.0–17.0)
LYMPHS ABS: 2.1 10*3/uL (ref 0.7–4.0)
LYMPHS PCT: 27 %
MCH: 32.1 pg (ref 26.0–34.0)
MCHC: 34.5 g/dL (ref 30.0–36.0)
MCV: 92.9 fL (ref 78.0–100.0)
MONO ABS: 0.5 10*3/uL (ref 0.1–1.0)
Monocytes Relative: 7 %
NEUTROS ABS: 5 10*3/uL (ref 1.7–7.7)
NEUTROS PCT: 63 %
Platelets: 261 10*3/uL (ref 150–400)
RBC: 5.08 MIL/uL (ref 4.22–5.81)
RDW: 12.4 % (ref 11.5–15.5)
WBC: 7.8 10*3/uL (ref 4.0–10.5)

## 2017-10-16 LAB — BASIC METABOLIC PANEL
ANION GAP: 10 (ref 5–15)
BUN: 12 mg/dL (ref 6–20)
CHLORIDE: 99 mmol/L (ref 98–111)
CO2: 29 mmol/L (ref 22–32)
Calcium: 9.3 mg/dL (ref 8.9–10.3)
Creatinine, Ser: 1.12 mg/dL (ref 0.61–1.24)
Glucose, Bld: 116 mg/dL — ABNORMAL HIGH (ref 70–99)
POTASSIUM: 3.7 mmol/L (ref 3.5–5.1)
SODIUM: 138 mmol/L (ref 135–145)

## 2017-10-16 LAB — ACETAMINOPHEN LEVEL: Acetaminophen (Tylenol), Serum: <10 ug/mL — ABNORMAL LOW (ref 10–30)

## 2017-10-16 LAB — ETHANOL: Alcohol, Ethyl (B): <10 mg/dL (ref <10)

## 2017-10-16 LAB — SALICYLATE LEVEL: Salicylate Lvl: <7 mg/dL (ref 2.8–30.0)

## 2017-10-16 MED ORDER — CLONIDINE HCL 0.1 MG PO TABS: 0.1000 mg | ORAL_TABLET | Freq: Two times a day (BID) | ORAL | Status: DC

## 2017-10-16 MED ORDER — HYDROXYZINE HCL 25 MG PO TABS: 25.0000 mg | ORAL_TABLET | Freq: Four times a day (QID) | ORAL | Status: DC | PRN

## 2017-10-16 MED ORDER — ONDANSETRON 4 MG PO TBDP: 4.0000 mg | ORAL_TABLET | Freq: Four times a day (QID) | ORAL | Status: DC | PRN

## 2017-10-16 MED ORDER — NAPROXEN 250 MG PO TABS: 500.0000 mg | ORAL_TABLET | Freq: Two times a day (BID) | ORAL | Status: DC | PRN

## 2017-10-16 MED ORDER — DICYCLOMINE HCL 10 MG PO CAPS: 20.0000 mg | ORAL_CAPSULE | Freq: Four times a day (QID) | ORAL | Status: DC | PRN

## 2017-10-16 MED ORDER — CLONIDINE HCL 0.1 MG PO TABS
0.1000 mg | ORAL_TABLET | Freq: Four times a day (QID) | ORAL | Status: DC
  Administered 2017-10-16: 0.1 mg via ORAL
  Filled 2017-10-16: qty 1

## 2017-10-16 MED ORDER — CLONIDINE HCL 0.1 MG PO TABS: 0.1000 mg | ORAL_TABLET | Freq: Every day | ORAL | Status: DC

## 2017-10-16 MED ORDER — METHOCARBAMOL 500 MG PO TABS: 500.0000 mg | ORAL_TABLET | Freq: Three times a day (TID) | ORAL | Status: DC | PRN

## 2017-10-16 MED ORDER — LOPERAMIDE HCL 2 MG PO CAPS: 2.0000 mg | ORAL_CAPSULE | ORAL | Status: DC | PRN
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
## 2017-10-16 NOTE — Discharge Instructions (Addendum)
Substance Abuse Treatment Programs ° °Intensive Outpatient Programs °[facility]     °[address]      °[place], [place]                   °[phone]      ° °[facility] °[address] °[place], [place] °[phone] ° °[facility]     °(Inpatient and outpatient)     °[address]           °[phone]   ° °[facility] °336-288-1484 (Suboxone and Methadone) ° °[address]      °[place], [place] [address]      °336-882-2125      ° °[address] °[place], [place] °852-3033 ° °[facility] (Outpatient/Inpatient, Chemical)    °(insurance only) [phone]      °       °[facility] (Groups & Residential) °[place], [place] °336-389-1413 ° °   °[facility]     °[address]     °[place], [place]      °[phone]      ° °[facility] (for caregivers and family) °[address] °[place], [place] °336-299-4655 ° ° ° ° ° °Residential Treatment Programs °[facility]      °[address], [place], [place] [address]  °([phone]      ° °[facility] °[address] [place], [place] [address] °[phone] ° °[facility]        °336-248-8914      ° °[facility] °1-800-659-3381 ° °[facility]             °[address]                                         °[place], [place]                                                °877-615-2722 or 336-784-9470                              ° °[facility] [place] °[address] °[place], [address] °1.877.941.8954 ° °[facility]    °[address]      °, [place]     °[phone]      ° °[facility] °[address] °, [place] °336-285-9073 ° °[facility]   °[address]     °[place], [place] [address]     °336-899-1550      °Admissions: 8am-3pm M-F ° °[facility] °[address] °[place] °336-227-7417 ° °[facility]: Residential Program (90 Days)   °[place], [place]      °336-725-8389 or 800-758-6077    ° °[facility] °[place], [place] °(Walk in Hours over the weekend or by referral) ° °[facility] °[address], [place], [place] [address] °(336) [phone] ° °Crisis Mobile: [facility]:  1-877-626-1772 (for crisis response 24 hours a day) °[facility] Hotline:      1-800-256-2452 °Outpatient Psychiatry and Counseling ° °[facility]: Mobile Crisis   Management 24 hours:  1-877-626-1772 ° °[facility] sliding scale fee and walk in schedule: M-F 8am-12pm/1pm-3pm °[address]  °[place], [place] [address] °336-387-6161 ° °[facility] °[address] °[place], [place] [address] °336-703-9650 ° °[facility] ([facility])- new patient walk-in appointments available Monday - Friday 8am -3pm.          °[address] °[place], [place] [address] °336-676-6840 or crisis line- 336-676-6905 ° °[facility] Intensive Outpatient Therapy Program °[address] °[place], [place] [address] °336-832-9804 ° °[facility]                  °Crisis Services      °336.641.4993      °[address]     °[place], [place] [address]                ° °[facility]   °[facility] °[phone] °[address] °[place], [place] [address] ° ° °[facility]          °[address]  °[place], [place] [address]       °(336) [phone] ° °[facility] °[address] °[place], [place] [address] °336-292-1510 ° °[facility]    °[address]    °[place], [place] [address]     °336-632-3505      ° °Parish McKinney, MD     °[address]     °[place] [address]     °336-282-1251     °  °[facility] °[address] °[place] [address] ° °[facility]     °[address]     °[place], [place]      °336-542-2076      ° °[facility] °Shamsher Ahluwalia, MD °[address] °[place], [place] [address] °336-420-9558 ° °[facility]     °[address]     °[place], [place] [address]     °336-274-1237      ° °[facility] °[address] °[place], [place] [address] °336-854-4450 °Resources for Temporary Residential Assistance/Crisis Centers ° °[facility] °[facility] °M-F 8am-3pm   °[address] [place], [place] [address]   336-332-0824 °Services include: laundry, barbering, support groups, case management, phone  & computer access, showers, AA/NA mtgs, mental health/substance abuse nurse, job skills class, disability information, VA assistance, spiritual classes, etc.  ° °HOMELESS SHELTERS ° °[facility]     °[facility]   °[address], [place]     °336.271.5959       °       °[facility] (women and children)       °[address] °[place], [place] [address] °336-275-0820 °[email or web address] for application and process °Application Required ° °[facility]   °[address]    °[place] [address]     °336.886.4922       °             °[facility] °[address] °[place], [place] [address] °336.273.5572 °336-235-0363(schedule application appt.) °Application Required ° °[facility] (women only)    °[address]     °[place], [place] [address]     °336-884-1039      °  Intake starts 6pm daily Need valid ID, SSC, & Police report [facility] [place] [address] [place], [place] [phone] Application Required  [facility] (men only)     [address]      [place], [place]     [phone]       [facility] (Pregnant women only) [address] [place], [place] [phone]  [facility]      [address]      [place], [place] [address]     [phone]             [facility] [address] [place], [place] [phone] 90 day commitment/SA/Application process  [facility](men only)     [address]     [place], [place]     [phone]       Check-in at [facility]            [facility] [address] [place], [place] [address] [phone] Men/Women/Women and Children must be there by 7 pm  [facility] [place] [phone]                  Take your usual prescriptions as previously directed.  Call the resources given to you today if you are interested in a detox program.  Return to the Emergency Department immediately sooner if worsening.

## 2017-10-16 NOTE — Consult Note (Signed)
  Tele Assessment   Adam Sanders, 27 y.o., male patient seen via telepsych by TTS and this provider; chart reviewed and consulted with Dr. Lucianne MussKumar on 10/16/17.  On evaluation Adam Sanders reports that he has a problem with heroin and he wants to get into a rehab facility.  Patient states that he wants to get into a rehab facility.  "I don't need no damn mental help; I want to get into a rehab that's longer than 7 days.  When I came to the hospital I told them that I wanted to get help to get off of heroin; the lady was like no, no, no, the only way we can help you is if you want to hurt your self; so I said I wanted to hurt myself.  I lied cause that is the only I can get some help.  Patient states "I lied; I would never try to kill myself; I just want some help to get some where to get off of this heroin." Explained the difference between being admitted for psychiatric treatment vs seeking rehabilitation services in a long term facility.  Also explained to a patient most rehab facilities don't take patient that are suicidal because they don't have the staff for one on one monitoring and it would be to big of a risk for that facility.  People that are suicidal need psychiatric treatment.  Understanding voiced.  Also explained to patient in order to get into a rehab facility he needs to call everyday; have his name put on wait list for bed availability.   During evaluation Adam Sanders is alert/oriented x 4; calm/cooperative; and mood congruent with affect.  He does not appear to be responding to internal/external stimuli or delusional thoughts.  Patient denies suicidal/self-harm/homicidal ideation, psychosis, and paranoia.  Patient answered question appropriately.  Patient psychiatrically cleared  For detailed not see TTS tele assessment note   Recommendations:  Resource information on long and short term rehab facilities; also sent information on [facility]  Disposition: No evidence of imminent  risk to self or others at present.   Patient does not meet criteria for psychiatric inpatient admission. Discussed crisis plan, support from social network, calling 911, coming to the Emergency Department, and calling Suicide Hotline.  Spoke with Lurena Joinerebecca, RN;  Dr. Clarene DukeMcManus on phone getting report on another patient; Lurena JoinerRebecca RN; informed of above recommendation and disposition and she will inform Dr Ervin KnackMcManus   Kharis Lapenna B. Keygan Dumond, NP

## 2017-10-16 NOTE — ED Triage Notes (Signed)
Pt saw [facility] today due to feeling suicidal and wanting to detox off of heroin. Pt denies plan for suicide. [facility] sent pt to ED for further evaluation. Pt supposed to be taking Zoloft, Gabapentin and Trazodone but he stopped taking them and then got back on heroin. Pt reports he had similar episode of this back in April of 2019.
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
## 2017-10-16 NOTE — ED Notes (Signed)
Charge RN notified of patient.

## 2017-11-07 ENCOUNTER — Other Ambulatory Visit: Payer: Self-pay

## 2017-11-07 ENCOUNTER — Encounter (HOSPITAL_COMMUNITY): Payer: Self-pay

## 2017-11-07 ENCOUNTER — Emergency Department (HOSPITAL_COMMUNITY)
Admission: EM | Admit: 2017-11-07 | Discharge: 2017-11-07 | Disposition: A | Discharge status: Home / Self Care | Payer: Self-pay | Attending: Emergency Medicine | Admitting: Emergency Medicine

## 2017-11-07 ENCOUNTER — Emergency Department (HOSPITAL_COMMUNITY): Payer: Self-pay

## 2017-11-07 DIAGNOSIS — Z79899 Other long term (current) drug therapy: Secondary | ICD-10-CM | POA: Insufficient documentation

## 2017-11-07 DIAGNOSIS — S62619A Displaced fracture of proximal phalanx of unspecified finger, initial encounter for closed fracture: Secondary | ICD-10-CM | POA: Insufficient documentation

## 2017-11-07 DIAGNOSIS — Y939 Activity, unspecified: Secondary | ICD-10-CM | POA: Insufficient documentation

## 2017-11-07 DIAGNOSIS — F17210 Nicotine dependence, cigarettes, uncomplicated: Secondary | ICD-10-CM | POA: Insufficient documentation

## 2017-11-07 DIAGNOSIS — Y929 Unspecified place or not applicable: Secondary | ICD-10-CM | POA: Insufficient documentation

## 2017-11-07 DIAGNOSIS — Y999 Unspecified external cause status: Secondary | ICD-10-CM | POA: Insufficient documentation

## 2017-11-07 DIAGNOSIS — W1830XA Fall on same level, unspecified, initial encounter: Secondary | ICD-10-CM | POA: Insufficient documentation

## 2017-11-07 MED ORDER — OXYCODONE HCL 5 MG PO TABS
5.0000 mg | ORAL_TABLET | Freq: Once | ORAL | Status: AC
  Administered 2017-11-07: 5 mg via ORAL
  Filled 2017-11-07: qty 1

## 2017-11-07 MED ORDER — HYDROCODONE-ACETAMINOPHEN 5-325 MG PO TABS: 1.0000 | ORAL_TABLET | Freq: Four times a day (QID) | ORAL | 0 refills | Status: AC | PRN

## 2017-11-07 MED ORDER — IBUPROFEN 200 MG PO TABS
600.0000 mg | ORAL_TABLET | Freq: Once | ORAL | Status: AC
  Administered 2017-11-07: 600 mg via ORAL
  Filled 2017-11-07: qty 3

## 2017-11-07 NOTE — Discharge Instructions (Addendum)
You have been seen today for a finger injury.  Pain: Take 600 mg of ibuprofen every 6 hours or 440 mg (over the counter dose) to 500 mg (prescription dose) of naproxen every 12 hours for the next 3 days. After this time, these medications may be used as needed for pain. Take these medications with food to avoid upset stomach. Choose only one of these medications, do not take them together.  Tylenol: Should you continue to have additional pain while taking the ibuprofen or naproxen, you may add in tylenol as needed. Your daily total maximum amount of tylenol from all sources should be limited to 4000mg /day for persons without liver problems, or 2000mg /day for those with liver problems. Vicodin: May take Vicodin as needed for severe pain.  Do not drive or perform other dangerous activities while taking the Vicodin.  Please note that each pill of Vicodin contains 325 mg of Tylenol and the above dosage limits apply. Ice: May apply ice to the area over the next 24 hours for 15 minutes at a time to reduce swelling. Elevation: Keep the extremity elevated as often as possible to reduce pain and inflammation. Support: Keep the splint clean and dry. Follow up: Follow-up with the hand specialist as soon as possible. Return: Return to the ED for numbness, weakness, increasing pain, overall worsening symptoms, loss of function, or any other major complaints.
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
## 2017-11-07 NOTE — ED Triage Notes (Signed)
Pt reports R hand pain. He reports breaking his R pinky about 2 weeks ago. He states that pain increased yesterday. Finger is splinted. A&Ox4. Ambulatory.

## 2019-12-03 IMAGING — CT CT RENAL STONE PROTOCOL
2 of 3 series · 17 of 46 positions shown, 19 images · non-contrast
Comparison: 03/04/2011

CLINICAL DATA: Right lower abdominal pain wrapping around of the
right low back beginning yesterday evening around 6 p.m.. Nausea.
Difficulty urinating.

EXAM:
CT ABDOMEN AND PELVIS WITHOUT CONTRAST
TECHNIQUE: Multidetector CT imaging of the abdomen and pelvis was performed
following the standard protocol without IV contrast.

[Series 2: axial st · axial · 0.81mm/px · z∈[-392,+8]mm · 14 of 92 slices shown, 16 images]
[im 6/92  soft-tissue]
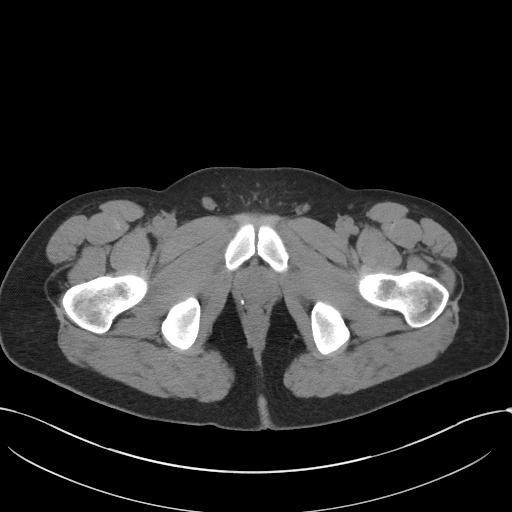
[im 6/92  bone]
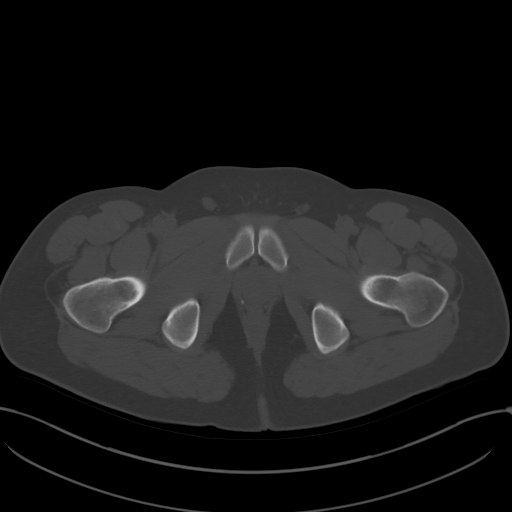
[im 12/92  soft-tissue]
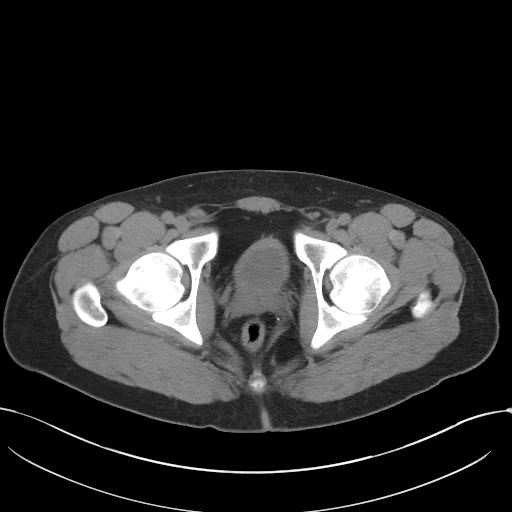
[im 18/92  soft-tissue]
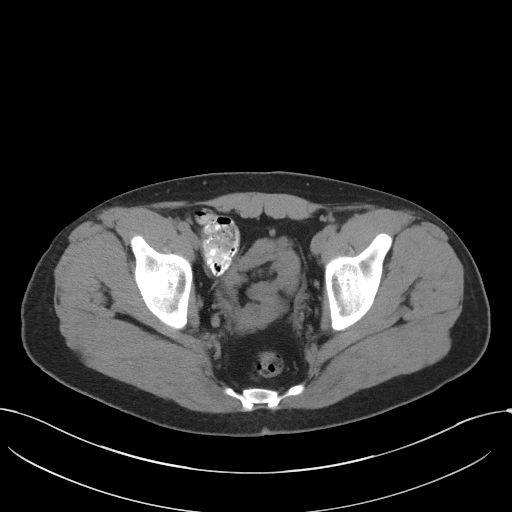
[im 24/92  soft-tissue]
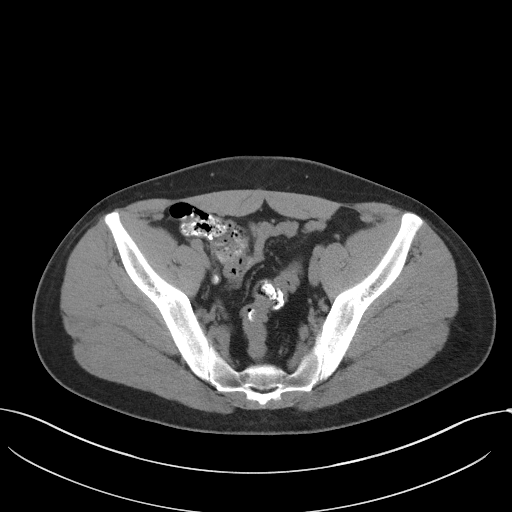
[im 30/92  soft-tissue]
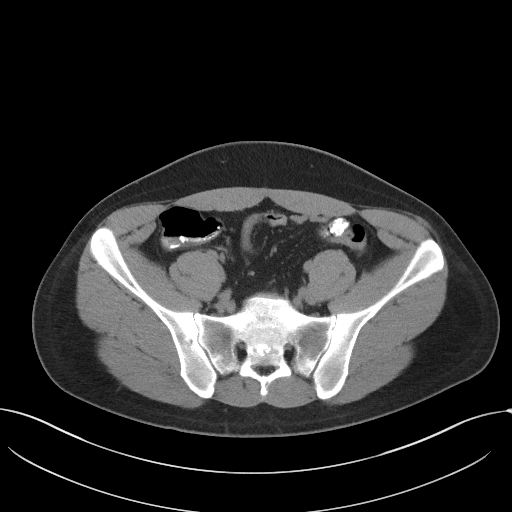
[im 36/92  soft-tissue]
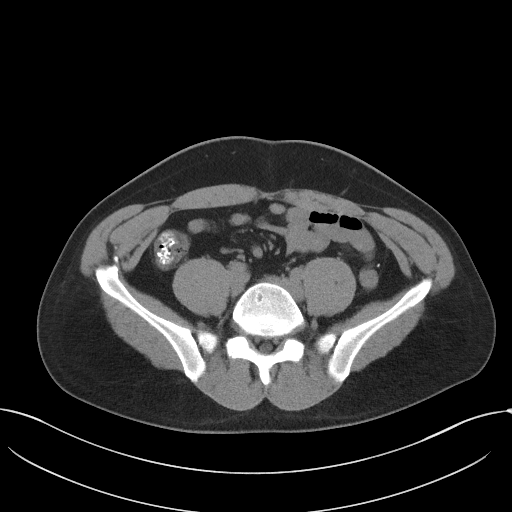
[im 42/92  soft-tissue]
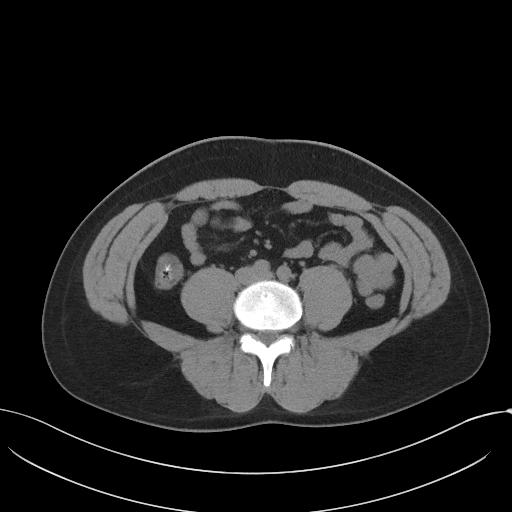
[im 50/92  soft-tissue]
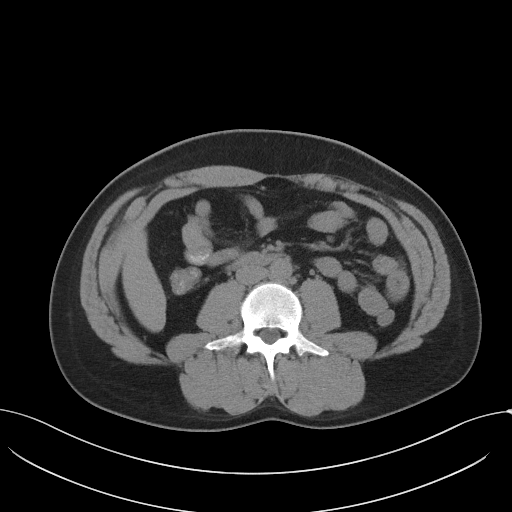
[im 56/92  soft-tissue]
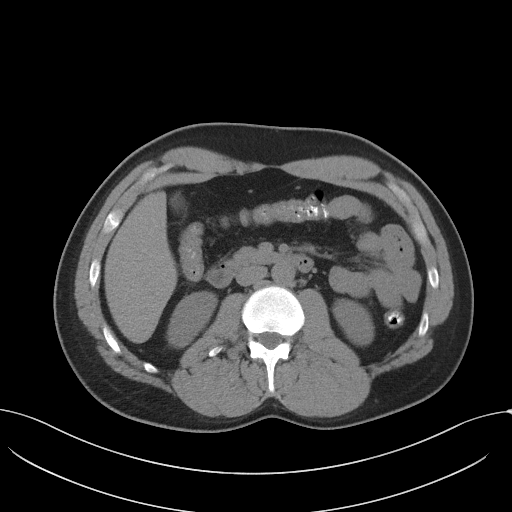
[im 56/92  bone]
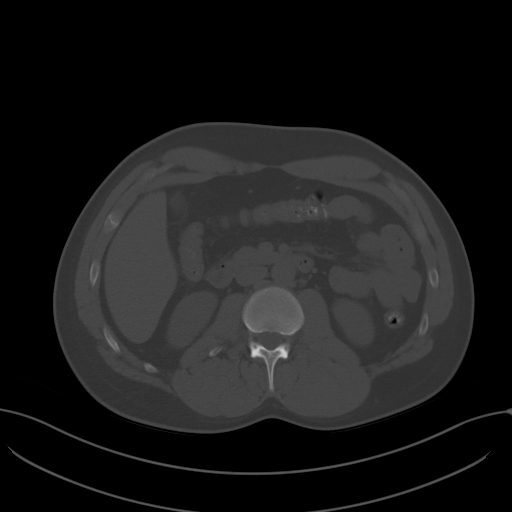
[im 62/92  soft-tissue]
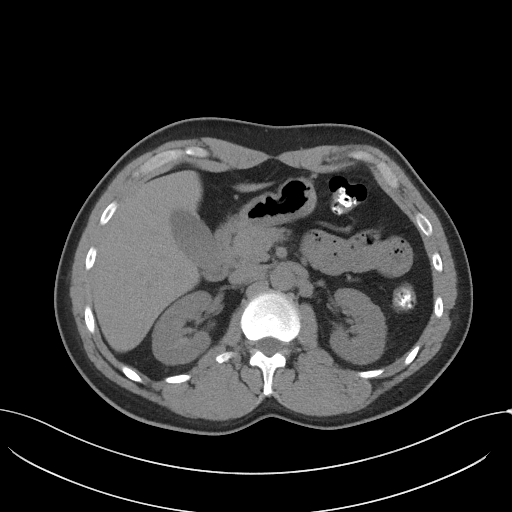
[im 68/92  soft-tissue]
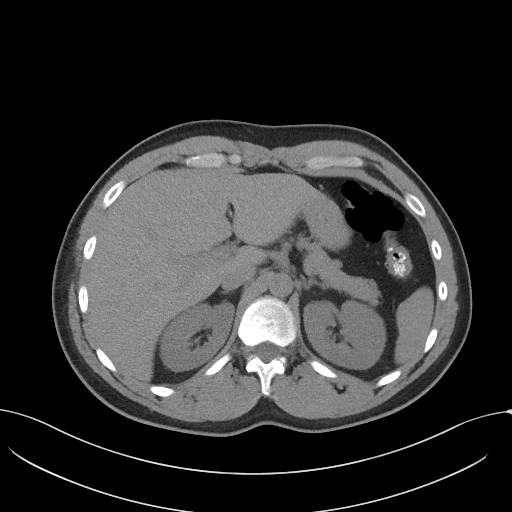
[im 74/92  soft-tissue]
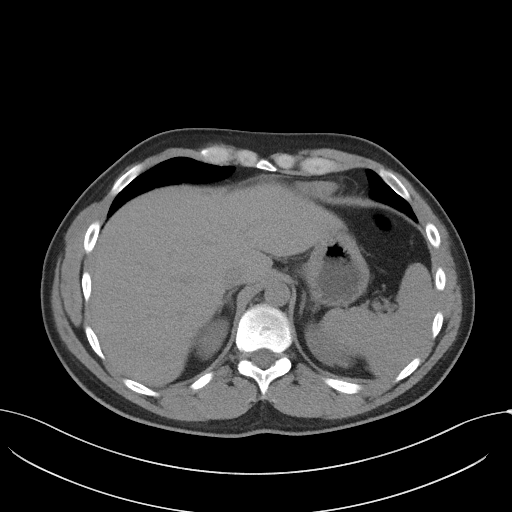
[im 80/92  soft-tissue]
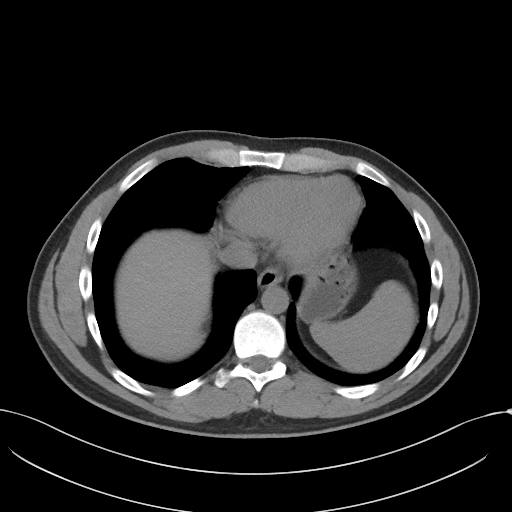
[im 86/92  soft-tissue]
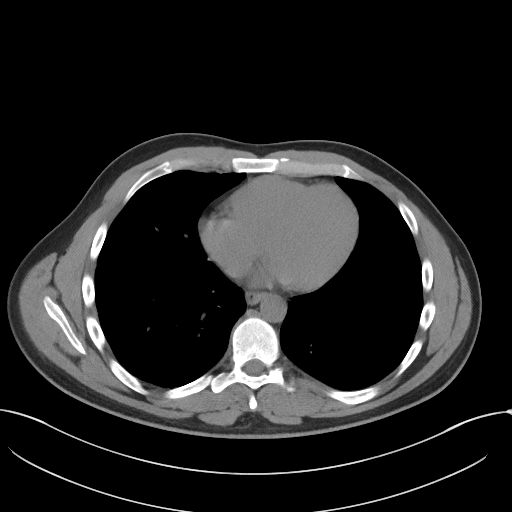

[Series 5: coronal st · coronal · 0.80mm/px · 3 of 83 slices shown]
[im 28/83  soft-tissue]
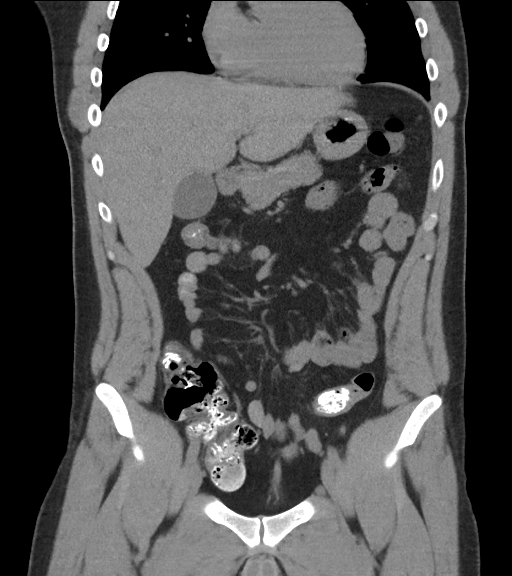
[im 37/83  soft-tissue]
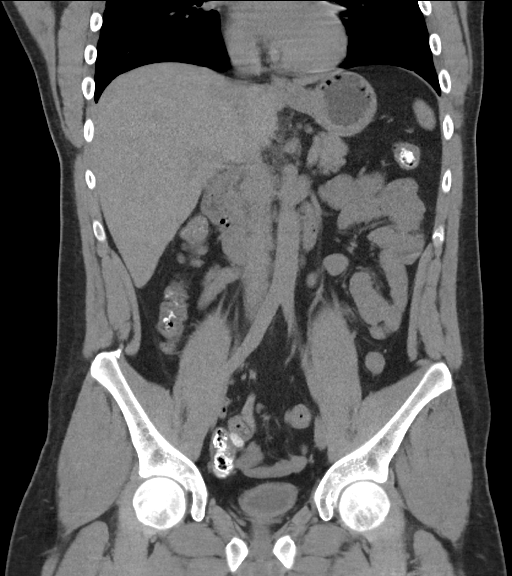
[im 46/83  soft-tissue]
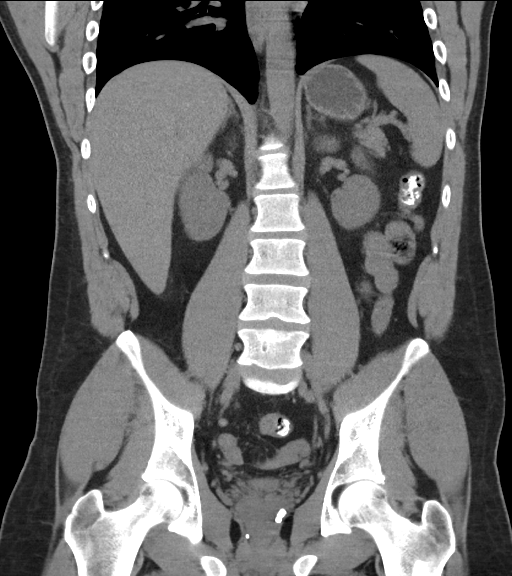

[17 of 46 positions shown; findings below may reference images not displayed]

FINDINGS: Lower chest: The lung bases are clear.

Hepatobiliary: No focal liver abnormality is seen. No gallstones,
gallbladder wall thickening, or biliary dilatation.

Pancreas: Unremarkable. No pancreatic ductal dilatation or
surrounding inflammatory changes.

Spleen: Normal in size without focal abnormality.

Adrenals/Urinary Tract: Adrenal glands are unremarkable. Kidneys are
normal, without renal calculi, focal lesion, or hydronephrosis.
Bladder is decompressed, limiting evaluation.

Stomach/Bowel: Stomach, small bowel, and colon are mostly
decompressed. Residual contrast material and some residual stool in
the colon. No wall thickening or inflammatory infiltration is
identified. Appendix is normal.

Vascular/Lymphatic: No significant vascular findings are present. No
enlarged abdominal or pelvic lymph nodes.

Reproductive: Prostate is unremarkable.

Other: No abdominal wall hernia or abnormality. No abdominopelvic
ascites.

Musculoskeletal: No acute or significant osseous findings.
IMPRESSION: 1. No renal or ureteral stone or obstruction.
2. No acute process demonstrated in the abdomen or pelvis on
noncontrast imaging. No evidence of bowel obstruction or
inflammation. Appendix is normal.

## 2020-03-29 IMAGING — CR DG CHEST 2V
2 series · 2 of 2 positions shown · non-contrast
Comparison: CT 03/05/2011

CLINICAL DATA: Chest pain

EXAM:
CHEST - 2 VIEW

[w chest pa]
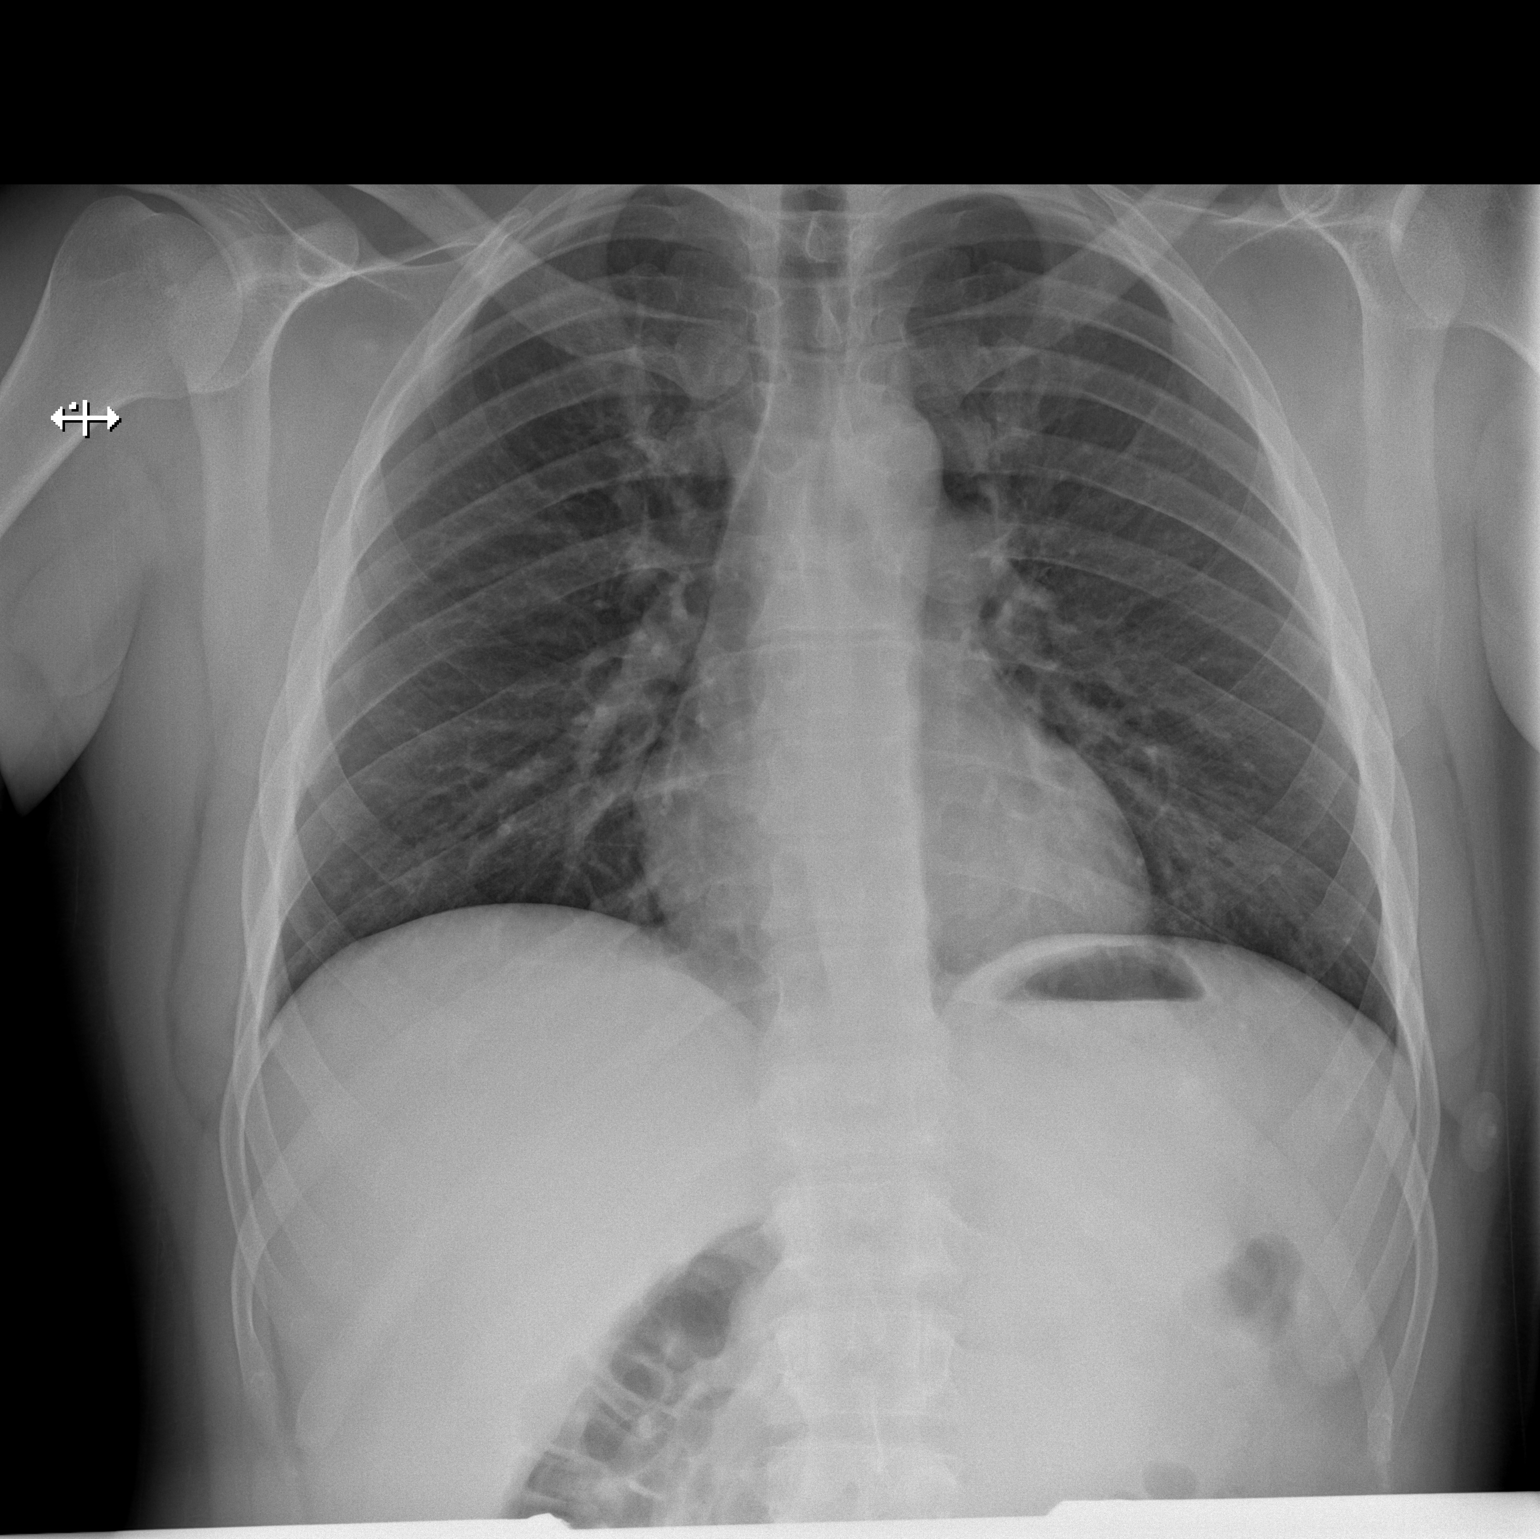

[w chest lat]
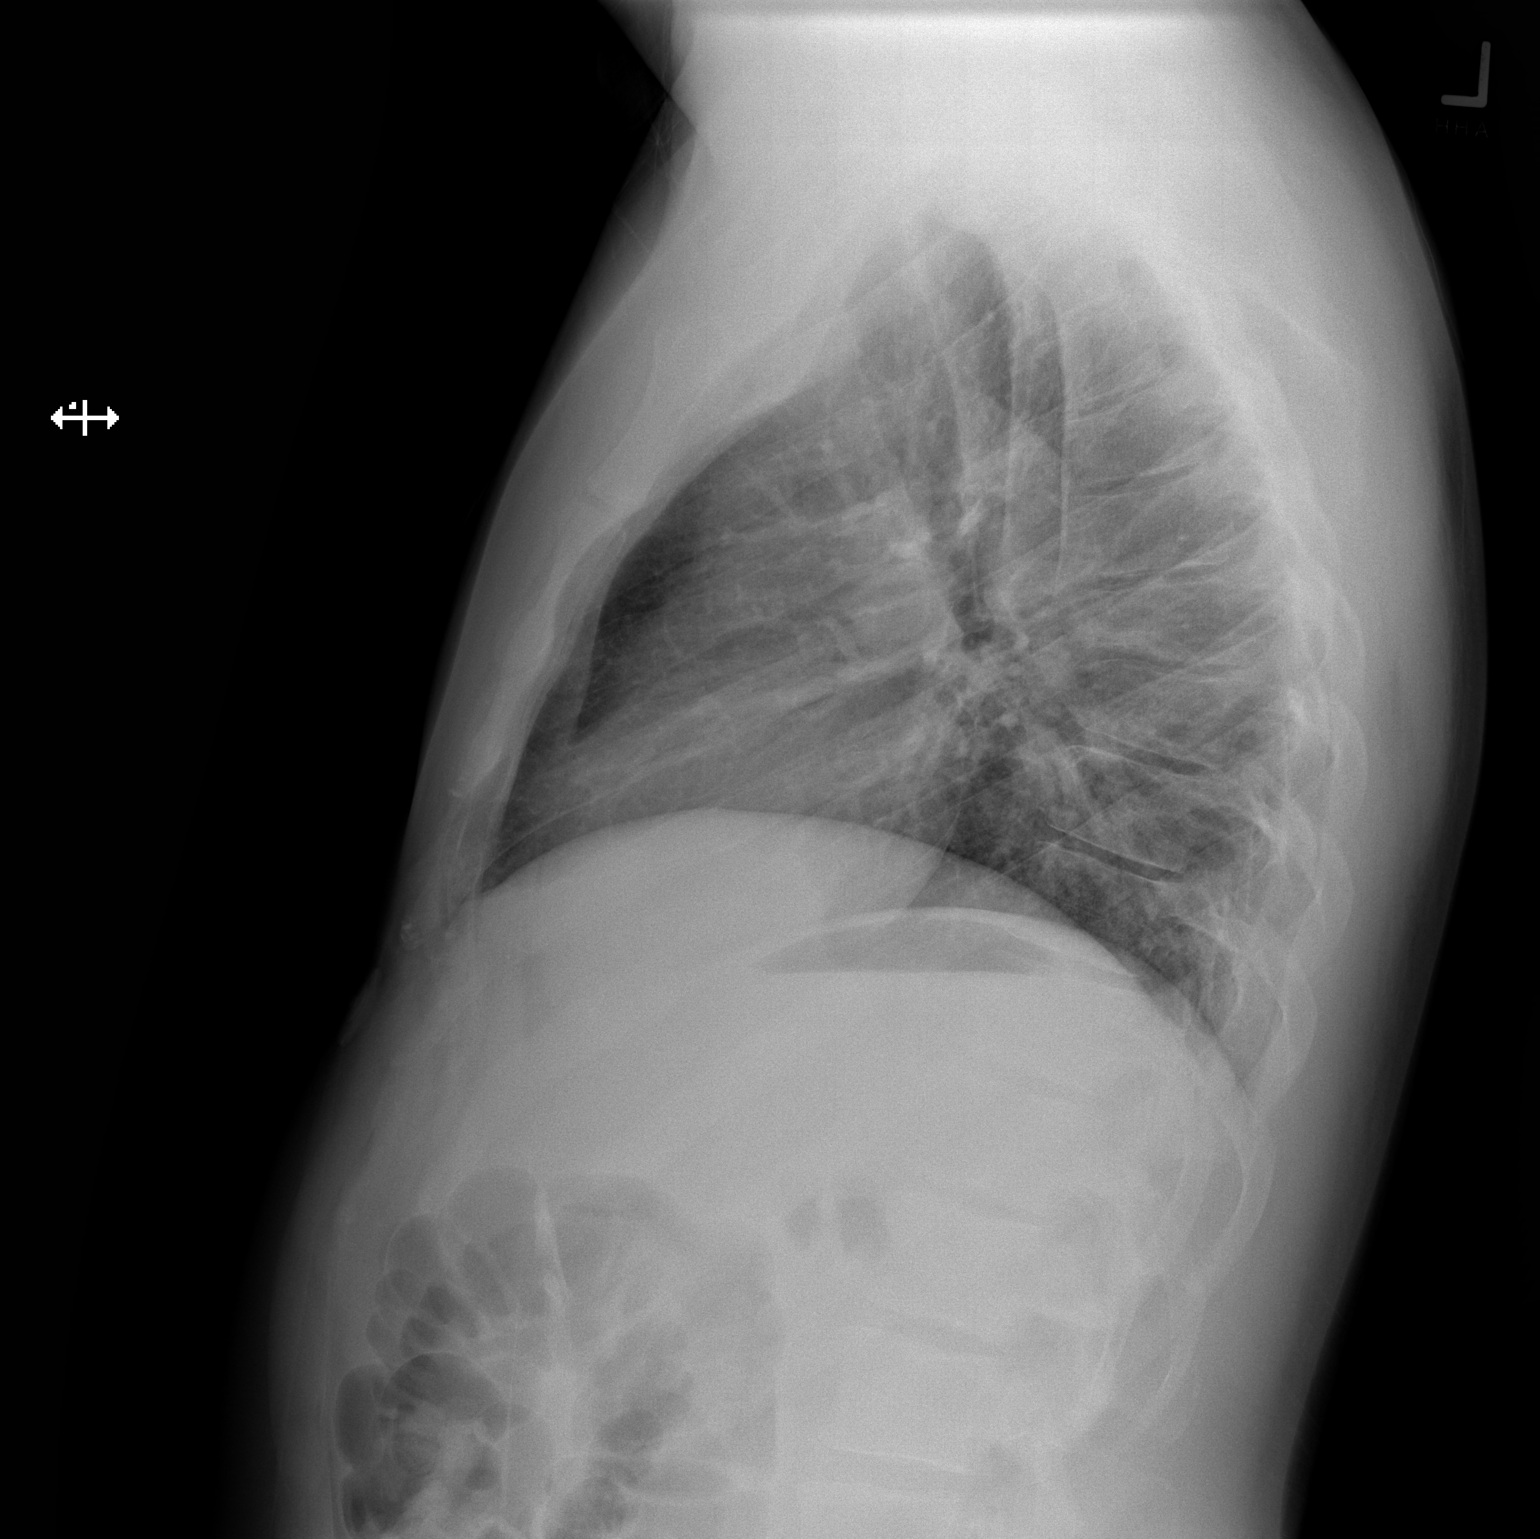

[2 of 2 positions shown; findings below may reference images not displayed]

FINDINGS: The heart size and mediastinal contours are within normal limits.
Both lungs are clear. Chronic wedging of midthoracic vertebra.
IMPRESSION: No active cardiopulmonary disease.
